# Patient Record
Sex: Female | Born: 1955 | Race: Black or African American | Hispanic: No | Smoking: Current every day smoker
Health system: Southern US, Community
[De-identification: ages and names within clinical notes are randomized; demographics above are authoritative.]

## PROBLEM LIST (undated history)

## (undated) DIAGNOSIS — F39 Unspecified mood [affective] disorder: Secondary | ICD-10-CM

## (undated) DIAGNOSIS — G2581 Restless legs syndrome: Secondary | ICD-10-CM

## (undated) DIAGNOSIS — E119 Type 2 diabetes mellitus without complications: Secondary | ICD-10-CM

## (undated) DIAGNOSIS — F32A Depression, unspecified: Secondary | ICD-10-CM

## (undated) DIAGNOSIS — I2510 Atherosclerotic heart disease of native coronary artery without angina pectoris: Secondary | ICD-10-CM

## (undated) DIAGNOSIS — J449 Chronic obstructive pulmonary disease, unspecified: Secondary | ICD-10-CM

## (undated) DIAGNOSIS — I10 Essential (primary) hypertension: Secondary | ICD-10-CM

## (undated) DIAGNOSIS — E079 Disorder of thyroid, unspecified: Secondary | ICD-10-CM

## (undated) DIAGNOSIS — F419 Anxiety disorder, unspecified: Secondary | ICD-10-CM

## (undated) DIAGNOSIS — F329 Major depressive disorder, single episode, unspecified: Secondary | ICD-10-CM

## (undated) DIAGNOSIS — G4730 Sleep apnea, unspecified: Secondary | ICD-10-CM

## (undated) DIAGNOSIS — E7800 Pure hypercholesterolemia, unspecified: Secondary | ICD-10-CM

## (undated) DIAGNOSIS — M1990 Unspecified osteoarthritis, unspecified site: Secondary | ICD-10-CM

## (undated) HISTORY — DX: Atherosclerotic heart disease of native coronary artery without angina pectoris: I25.10

## (undated) HISTORY — DX: Disorder of thyroid, unspecified: E07.9

## (undated) HISTORY — DX: Type 2 diabetes mellitus without complications: E11.9

## (undated) HISTORY — PX: TUBAL LIGATION: SHX77

## (undated) HISTORY — PX: CARPAL TUNNEL RELEASE: SHX101

## (undated) HISTORY — PX: CARDIAC CATHETERIZATION: SHX172

## (undated) HISTORY — PX: NECK SURGERY: SHX720

## (undated) HISTORY — DX: Major depressive disorder, single episode, unspecified: F32.9

## (undated) HISTORY — DX: Depression, unspecified: F32.A

## (undated) HISTORY — PX: BREAST SURGERY: SHX581

---

## 2014-08-14 ENCOUNTER — Telehealth: Payer: Self-pay | Admitting: Family Medicine

## 2014-08-14 NOTE — Telephone Encounter (Signed)
Patient aware that we will not be able to see her as a new patient at this time do to our providers not willing to write her pain medication or the xanax.

## 2014-08-15 ENCOUNTER — Telehealth: Payer: Self-pay | Admitting: Family Medicine

## 2014-08-15 NOTE — Telephone Encounter (Signed)
Pt Alexis Munoz she was told yesterday we couldn't take her on as a new pt. She called back today trying to schedule appt, advised pt we can't take her on as a new pt and that she could call another family practice. Pt voiced understanding.

## 2014-08-27 ENCOUNTER — Emergency Department (HOSPITAL_COMMUNITY)
Admission: EM | Admit: 2014-08-27 | Discharge: 2014-08-27 | Disposition: A | Discharge status: Home / Self Care | Attending: Emergency Medicine | Admitting: Emergency Medicine

## 2014-08-27 ENCOUNTER — Encounter (HOSPITAL_COMMUNITY): Payer: Self-pay | Admitting: Emergency Medicine

## 2014-08-27 DIAGNOSIS — J029 Acute pharyngitis, unspecified: Secondary | ICD-10-CM | POA: Insufficient documentation

## 2014-08-27 DIAGNOSIS — F419 Anxiety disorder, unspecified: Secondary | ICD-10-CM | POA: Diagnosis not present

## 2014-08-27 DIAGNOSIS — F319 Bipolar disorder, unspecified: Secondary | ICD-10-CM | POA: Insufficient documentation

## 2014-08-27 DIAGNOSIS — J449 Chronic obstructive pulmonary disease, unspecified: Secondary | ICD-10-CM | POA: Insufficient documentation

## 2014-08-27 DIAGNOSIS — Z880 Allergy status to penicillin: Secondary | ICD-10-CM | POA: Diagnosis not present

## 2014-08-27 DIAGNOSIS — Z79899 Other long term (current) drug therapy: Secondary | ICD-10-CM | POA: Diagnosis not present

## 2014-08-27 DIAGNOSIS — Z720 Tobacco use: Secondary | ICD-10-CM | POA: Diagnosis not present

## 2014-08-27 DIAGNOSIS — Z8669 Personal history of other diseases of the nervous system and sense organs: Secondary | ICD-10-CM | POA: Insufficient documentation

## 2014-08-27 DIAGNOSIS — Z760 Encounter for issue of repeat prescription: Secondary | ICD-10-CM | POA: Diagnosis not present

## 2014-08-27 DIAGNOSIS — M1990 Unspecified osteoarthritis, unspecified site: Secondary | ICD-10-CM | POA: Insufficient documentation

## 2014-08-27 DIAGNOSIS — E119 Type 2 diabetes mellitus without complications: Secondary | ICD-10-CM | POA: Insufficient documentation

## 2014-08-27 DIAGNOSIS — R21 Rash and other nonspecific skin eruption: Secondary | ICD-10-CM

## 2014-08-27 HISTORY — DX: Restless legs syndrome: G25.81

## 2014-08-27 HISTORY — DX: Anxiety disorder, unspecified: F41.9

## 2014-08-27 HISTORY — DX: Type 2 diabetes mellitus without complications: E11.9

## 2014-08-27 HISTORY — DX: Unspecified mood (affective) disorder: F39

## 2014-08-27 HISTORY — DX: Sleep apnea, unspecified: G47.30

## 2014-08-27 HISTORY — DX: Unspecified osteoarthritis, unspecified site: M19.90

## 2014-08-27 HISTORY — DX: Chronic obstructive pulmonary disease, unspecified: J44.9

## 2014-08-27 MED ORDER — ACYCLOVIR 800 MG PO TABS: 800.0000 mg | ORAL_TABLET | Freq: Every day | ORAL | Status: DC

## 2014-08-27 MED ORDER — BUDESONIDE-FORMOTEROL FUMARATE 160-4.5 MCG/ACT IN AERO: 2.0000 | INHALATION_SPRAY | Freq: Two times a day (BID) | RESPIRATORY_TRACT | Status: AC

## 2014-08-27 MED ORDER — HYDROCODONE-ACETAMINOPHEN 5-325 MG PO TABS
1.0000 | ORAL_TABLET | ORAL | Status: DC | PRN
Start: 2014-08-27 — End: 2015-05-22

## 2014-08-27 NOTE — ED Notes (Signed)
PA at bedside.

## 2014-08-27 NOTE — Discharge Instructions (Signed)
Pharyngitis Pharyngitis is a sore throat (pharynx). There is redness, pain, and swelling of your throat. HOME CARE   Drink enough fluids to keep your pee (urine) clear or pale yellow.  Only take medicine as told by your doctor.  You may get sick again if you do not take medicine as told. Finish your medicines, even if you start to feel better.  Do not take aspirin.  Rest.  Rinse your mouth (gargle) with salt water ( tsp of salt per 1 qt of water) every 1-2 hours. This will help the pain.  If you are not at risk for choking, you can suck on hard candy or sore throat lozenges. GET HELP IF:  You have large, tender lumps on your neck.  You have a rash.  You cough up green, yellow-brown, or bloody spit. GET HELP RIGHT AWAY IF:   You have a stiff neck.  You drool or cannot swallow liquids.  You throw up (vomit) or are not able to keep medicine or liquids down.  You have very bad pain that does not go away with medicine.  You have problems breathing (not from a stuffy nose). MAKE SURE YOU:   Understand these instructions.  Will watch your condition.  Will get help right away if you are not doing well or get worse. Document Released: 03/21/2008 Document Revised: 07/24/2013 Document Reviewed: 06/10/2013 [facility] Patient Information 2015 [place], [place]. This information is not intended to replace advice given to you by your health care provider. Make sure you discuss any questions you have with your health care provider.  Medication Refill, Emergency Department We have refilled your medication today as a courtesy to you. It is best for your medical care, however, to take care of getting refills done through your primary caregiver's office. They have your records and can do a better job of follow-up than we can in the emergency department. On maintenance medications, we often only prescribe enough medications to get you by until you are able to see your regular caregiver. This  is a more expensive way to refill medications. In the future, please plan for refills so that you will not have to use the emergency department for this. Thank you for your help. Your help allows us to better take care of the daily emergencies that enter our department. Document Released: 01/20/2004 Document Revised: 12/26/2011 Document Reviewed: 01/10/2014 [facility] Patient Information 2015 [place], [place]. This information is not intended to replace advice given to you by your health care provider. Make sure you discuss any questions you have with your health care provider.   You have been prescribed your Symbicort refill, use this daily as prescribed.  You may take the hydrocodone for throat pain relief and chronic pain relief, use caution as this medication will make you sleepy, do not drive within 4 hours of taking this medication.  As discussed, your rash is not clearly consistent with a shingles flare, however you have been prescribed acyclovir.  Hold this prescription as you may not need it.  If this rash continues to spread more painful, get this medicine filled and take as prescribed.  See the resource guide for assistance locating a local primary doctor.

## 2014-08-27 NOTE — ED Notes (Signed)
Pt c/o tonsilitis and area of rash to her R upper chest, onset yesterday. Pt states the rash itches but does not hurt.

## 2014-08-29 NOTE — ED Provider Notes (Signed)
CSN: [id number]     Arrival date & time 08/27/14  1028 History   First MD Initiated Contact with Patient 08/27/14 1131     Chief Complaint  Patient presents with  . Sore Throat  . Rash     (Consider location/radiation/quality/duration/timing/severity/associated sxs/prior Treatment) The history is provided by the patient.   Alexis Munoz is a 58 y.o. femalepresenting with a one-day history of sore throat and localized pruritic rash to her right upper chest.  She denies fevers or chills, headache, earache, but has had nasal congestion with clear rhinorrhea.  She has had no shortness of breath, cough or chest pain.  She has a localized area of itchy slightly raised red rash right upper chest wall without spreading.she describes a tingling sensation which reminds her of a mild form of shingles which she has had in the past.she is found no alleviators for her symptoms.  Additionally, has recently moved to [place] and has run out of several of her home medications including her Symbicort and her Xanax which she has not had in over one month.she is scheduled [facility] but is having difficulty locating a PCP accepting new patients.     Past Medical History  Diagnosis Date  . Arthritis   . Sleep apnea   . COPD (chronic obstructive pulmonary disease)   . Restless leg   . Diabetes mellitus without complication   . Mood disorder   . Bipolar 1 disorder   . Anxiety    Past Surgical History  Procedure Laterality Date  . Neck surgery    . Carpal tunnel release    . Tubal ligation     Family History  Problem Relation Age of Onset  . Cancer Mother   . Diabetes Father   . Cancer Father   . Diabetes Sister   . Heart failure Sister   . Schizophrenia Sister   . Cancer Brother    History  Substance Use Topics  . Smoking status: Current Every Day Smoker -- 0.50 packs/day    Types: Cigarettes  . Smokeless tobacco: Never Used  . Alcohol Use: No    OB History    Gravida Para Term Preterm AB TAB SAB Ectopic Multiple Living   2 2 2             Review of Systems  Constitutional: Negative.  Negative for fever.  HENT: Positive for congestion, rhinorrhea and sore throat.   Eyes: Negative.   Respiratory: Negative for cough, chest tightness, shortness of breath and wheezing.   Cardiovascular: Negative for chest pain.  Gastrointestinal: Negative for nausea and abdominal pain.  Genitourinary: Negative.   Musculoskeletal: Negative for joint swelling, arthralgias and neck pain.  Skin: Positive for rash. Negative for wound.  Neurological: Negative for dizziness, weakness, light-headedness, numbness and headaches.  Psychiatric/Behavioral: Negative for sleep disturbance, self-injury and agitation. The patient is nervous/anxious.       Allergies  Sulfa antibiotics and Amoxicillin  Home Medications   Prior to Admission medications   Medication Sig Start Date End Date Taking? Authorizing Provider  DULoxetine (CYMBALTA) 60 MG capsule Take 60 mg by mouth 2 (two) times daily.   Yes Historical Provider, MD  ipratropium-albuterol (DUONEB) 0.5-2.5 (3) MG/3ML SOLN Take 3 mLs by nebulization 4 (four) times daily.   Yes Historical Provider, MD  lamoTRIgine (LAMICTAL) 200 MG tablet Take 200 mg by mouth at bedtime.   Yes Historical Provider, MD  levothyroxine (SYNTHROID, LEVOTHROID) 50 MCG  tablet Take 50 mcg by mouth daily before breakfast.   Yes Historical Provider, MD  metoprolol succinate (TOPROL-XL) 25 MG 24 hr tablet Take 12.5 mg by mouth at bedtime.   Yes Historical Provider, MD  midodrine (PROAMATINE) 5 MG tablet Take 5 mg by mouth 2 (two) times daily with a meal.   Yes Historical Provider, MD  pravastatin (PRAVACHOL) 40 MG tablet Take 40 mg by mouth at bedtime.   Yes Historical Provider, MD  rOPINIRole (REQUIP) 1 MG tablet Take 1 mg by mouth at bedtime.   Yes Historical Provider, MD  vitamin B-12 (CYANOCOBALAMIN) 500 MCG tablet Take 500 mcg  by mouth daily.   Yes Historical Provider, MD  acyclovir (ZOVIRAX) 800 MG tablet Take 1 tablet (800 mg total) by mouth 5 (five) times daily. 08/27/14   Burgess AmorJulie Kennen Stammer, PA-C  ALPRAZolam Prudy Feeler(XANAX) 0.5 MG tablet Take 0.5 mg by mouth 3 (three) times daily.    Historical Provider, MD  budesonide-formoterol (SYMBICORT) 160-4.5 MCG/ACT inhaler Inhale 2 puffs into the lungs 2 (two) times daily. 08/27/14   Burgess AmorJulie Breslin Hemann, PA-C  HYDROcodone-acetaminophen (NORCO/VICODIN) 5-325 MG per tablet Take 1 tablet by mouth every 4 (four) hours as needed. 08/27/14   Burgess AmorJulie Tre Sanker, PA-C   BP 132/62 mmHg  Pulse 63  Temp(Src) 98.3 F (36.8 C) (Oral)  Resp 18  Ht 5\' 2"  (1.575 m)  Wt 150 lb (68.04 kg)  BMI 27.43 kg/m2  SpO2 99% Physical Exam  Constitutional: She is oriented to person, place, and time. She appears well-developed and well-nourished.  HENT:  Head: Normocephalic and atraumatic.  Right Ear: Tympanic membrane and ear canal normal.  Left Ear: Tympanic membrane and ear canal normal.  Nose: Mucosal edema and rhinorrhea present.  Mouth/Throat: Uvula is midline, oropharynx is clear and moist and mucous membranes are normal. No oropharyngeal exudate, posterior oropharyngeal edema, posterior oropharyngeal erythema or tonsillar abscesses.  Pharynx appears normal.  No head or neck lymphadenopathy.  Eyes: Conjunctivae are normal.  Cardiovascular: Normal rate, regular rhythm and normal heart sounds.   Pulmonary/Chest: Effort normal. No respiratory distress. She has no decreased breath sounds. She has no wheezes. She has no rhonchi. She has no rales.  Abdominal: Soft. There is no tenderness.  Musculoskeletal: Normal range of motion.  Neurological: She is alert and oriented to person, place, and time.  Skin: Skin is warm and dry. Rash noted. Rash is maculopapular.  Slightly raised maculopapular erythematous rash right upper anterior chest wall.  No drainage, no vesicles.  She has another similar lesion right back near her  spine which is nontender, also no drainage.  Psychiatric: She has a normal mood and affect.    ED Course  Procedures (including critical care time) Labs Review Labs Reviewed - No data to display  Imaging Review No results found.   EKG Interpretation None      MDM   Final diagnoses:  Sore throat  Rash  Medication refill    Patient with multiple complaints, suspect viral sore throat with no exam findings to suggest this being strep or other bacterial infection.her rash is fairly nonspecific and not tender as I would expect from shingles, but with her past history will prescribe acyclovir to catch this early in the event this actually is a shingles flare.  She was prescribed hydrocodone for pain including throat pain.  Refill given for her Symbicort.  She inquired about Xanax prescription which was deferred.  It has been over one month since she had this medication last so  benzo withdrawal is not a concern.  She has no symptoms today suggesting ongoing need for Xanax and she does have a pending follow-up with [facility] for this.  She was given referrals for establishing a PCP here locally.  When necessary follow-up anticipated.    Burgess AmorJulie Tanayia Wahlquist, PA-C 08/29/14 1747  Ward GivensIva L Knapp, MD 09/01/14 [phone]

## 2014-09-22 ENCOUNTER — Encounter (HOSPITAL_COMMUNITY): Payer: Self-pay | Admitting: *Deleted

## 2014-09-22 ENCOUNTER — Emergency Department (HOSPITAL_COMMUNITY)
Admission: EM | Admit: 2014-09-22 | Discharge: 2014-09-23 | Disposition: A | Discharge status: Home / Self Care | Attending: Emergency Medicine | Admitting: Emergency Medicine

## 2014-09-22 DIAGNOSIS — Z79899 Other long term (current) drug therapy: Secondary | ICD-10-CM | POA: Diagnosis not present

## 2014-09-22 DIAGNOSIS — M1990 Unspecified osteoarthritis, unspecified site: Secondary | ICD-10-CM | POA: Diagnosis not present

## 2014-09-22 DIAGNOSIS — Z7951 Long term (current) use of inhaled steroids: Secondary | ICD-10-CM | POA: Insufficient documentation

## 2014-09-22 DIAGNOSIS — F419 Anxiety disorder, unspecified: Secondary | ICD-10-CM | POA: Diagnosis not present

## 2014-09-22 DIAGNOSIS — J449 Chronic obstructive pulmonary disease, unspecified: Secondary | ICD-10-CM | POA: Diagnosis not present

## 2014-09-22 DIAGNOSIS — Z880 Allergy status to penicillin: Secondary | ICD-10-CM | POA: Diagnosis not present

## 2014-09-22 DIAGNOSIS — Z720 Tobacco use: Secondary | ICD-10-CM | POA: Insufficient documentation

## 2014-09-22 DIAGNOSIS — F10120 Alcohol abuse with intoxication, uncomplicated: Secondary | ICD-10-CM | POA: Diagnosis not present

## 2014-09-22 DIAGNOSIS — F319 Bipolar disorder, unspecified: Secondary | ICD-10-CM | POA: Insufficient documentation

## 2014-09-22 DIAGNOSIS — G2581 Restless legs syndrome: Secondary | ICD-10-CM | POA: Diagnosis not present

## 2014-09-22 DIAGNOSIS — F1010 Alcohol abuse, uncomplicated: Secondary | ICD-10-CM | POA: Diagnosis present

## 2014-09-22 DIAGNOSIS — F3131 Bipolar disorder, current episode depressed, mild: Secondary | ICD-10-CM

## 2014-09-22 DIAGNOSIS — F10920 Alcohol use, unspecified with intoxication, uncomplicated: Secondary | ICD-10-CM

## 2014-09-22 DIAGNOSIS — E119 Type 2 diabetes mellitus without complications: Secondary | ICD-10-CM | POA: Insufficient documentation

## 2014-09-22 NOTE — ED Provider Notes (Signed)
CSN: [id number]     Arrival date & time 09/22/14  2214 History  This chart was scribed for Ward Givens, MD by Annye Asa, ED Scribe. This patient was seen in room APA16A/APA16A and the patient's care was started at 12:17 AM.    Chief Complaint  Patient presents with  . Medical Clearance   The history is provided by the patient and a friend. No language interpreter was used.     HPI Comments: Alexis Munoz is a 58 y.o. female who presents to the Emergency Department for medical clearance and medication management. She reports that she has been out of her psych meds since September 2015 (she still has Cymbalta but ran out of Xanax and Lamictal in September); she was taking these medications for bipolar disorder. She is unable to explain why she has Cymbalta but does not have Xanax or Lamictal. She gets her medications by "[facility]." She reports no issue with her cholesterol or blood pressure medications; she has "a few" of these left at this time. She denies any prior admissions to a psychiatric hospital.   Patient reports that she has been drinking EtOH "because she doesn't have any meds." She reports regular drinking for approx. 12 years that had started escalating in the past year; she estimates that at most she drank 1 bottle of wine per day, every other day. Patient has had 3-4 Sparks today. She reports depression at present. She denies SI or HI at this time. She denies auditory hallucinations. She denies any prior admissions for detox or rehab.   Patient reports that she recently moved from [place] to [facility] and has not been able to establish care in the area; she does not have an appointment with [facility] at this time because "they are not accepting new patients." Patient states she has some type of VA insurance that the local doctors are not accepting.  PCP none  Past Medical History  Diagnosis Date  . Arthritis   . Sleep apnea   . COPD (chronic obstructive pulmonary disease)    . Restless leg   . Diabetes mellitus without complication   . Mood disorder   . Bipolar 1 disorder   . Anxiety    Past Surgical History  Procedure Laterality Date  . Neck surgery    . Carpal tunnel release    . Tubal ligation     Family History  Problem Relation Age of Onset  . Cancer Mother   . Diabetes Father   . Cancer Father   . Diabetes Sister   . Heart failure Sister   . Schizophrenia Sister   . Cancer Brother    History  Substance Use Topics  . Smoking status: Current Every Day Smoker -- 0.50 packs/day    Types: Cigarettes  . Smokeless tobacco: Never Used  . Alcohol Use: Yes     Comment: Drinks 3 days a week; 5-6 16oz Sparks     Patient does not work and is attempting to get disability. The last time she held a job was 2009; she worked in [employer]. Patient is a 1ppd smoker. She denies the use of illict drugs.   OB History    Gravida Para Term Preterm AB TAB SAB Ectopic Multiple Living   2 2 2             Review of Systems  All other systems reviewed and are negative.  Allergies  Sulfa antibiotics and Amoxicillin  Home Medications  Prior to Admission medications   Medication Sig Start Date End Date Taking? Authorizing Provider  acyclovir (ZOVIRAX) 800 MG tablet Take 1 tablet (800 mg total) by mouth 5 (five) times daily. 08/27/14   Burgess AmorJulie Idol, PA-C  ALPRAZolam Prudy Feeler(XANAX) 0.5 MG tablet Take 0.5 mg by mouth 3 (three) times daily.    Historical Provider, MD  budesonide-formoterol (SYMBICORT) 160-4.5 MCG/ACT inhaler Inhale 2 puffs into the lungs 2 (two) times daily. 08/27/14   Burgess AmorJulie Idol, PA-C  DULoxetine (CYMBALTA) 60 MG capsule Take 60 mg by mouth 2 (two) times daily.    Historical Provider, MD  HYDROcodone-acetaminophen (NORCO/VICODIN) 5-325 MG per tablet Take 1 tablet by mouth every 4 (four) hours as needed. 08/27/14   Burgess AmorJulie Idol, PA-C  ipratropium-albuterol (DUONEB) 0.5-2.5 (3) MG/3ML SOLN Take 3 mLs by nebulization 4 (four) times daily.    Historical  Provider, MD  lamoTRIgine (LAMICTAL) 200 MG tablet Take 200 mg by mouth at bedtime.    Historical Provider, MD  levothyroxine (SYNTHROID, LEVOTHROID) 50 MCG tablet Take 50 mcg by mouth daily before breakfast.    Historical Provider, MD  metoprolol succinate (TOPROL-XL) 25 MG 24 hr tablet Take 12.5 mg by mouth at bedtime.    Historical Provider, MD  midodrine (PROAMATINE) 5 MG tablet Take 5 mg by mouth 2 (two) times daily with a meal.    Historical Provider, MD  pravastatin (PRAVACHOL) 40 MG tablet Take 40 mg by mouth at bedtime.    Historical Provider, MD  rOPINIRole (REQUIP) 1 MG tablet Take 1 mg by mouth at bedtime.    Historical Provider, MD  vitamin B-12 (CYANOCOBALAMIN) 500 MCG tablet Take 500 mcg by mouth daily.    Historical Provider, MD   BP 119/54 mmHg  Pulse 85  Temp(Src) 98.6 F (37 C) (Oral)  Resp 20  Ht 5\' 2"  (1.575 m)  Wt 150 lb (68.04 kg)  BMI 27.43 kg/m2  SpO2 99%  Vital signs normal   Physical Exam  Constitutional: She is oriented to person, place, and time. She appears well-developed and well-nourished.  Non-toxic appearance. She does not appear ill. No distress.  Intoxicated with mild slurred speech  HENT:  Head: Normocephalic and atraumatic.  Right Ear: External ear normal.  Left Ear: External ear normal.  Nose: Nose normal. No mucosal edema or rhinorrhea.  Mouth/Throat: Oropharynx is clear and moist and mucous membranes are normal. No dental abscesses or uvula swelling.  Dry tongue; diffuse redness of upper and lower eyelids bilaterally  Eyes: Conjunctivae and EOM are normal. Pupils are equal, round, and reactive to light.  Neck: Normal range of motion and full passive range of motion without pain. Neck supple.  Cardiovascular: Normal rate, regular rhythm and normal heart sounds.  Exam reveals no gallop and no friction rub.   No murmur heard. Pulmonary/Chest: Effort normal and breath sounds normal. No respiratory distress. She has no wheezes. She has no  rhonchi. She has no rales. She exhibits no tenderness and no crepitus.  Abdominal: Soft. Normal appearance and bowel sounds are normal. She exhibits no distension. There is no tenderness. There is no rebound and no guarding.  Musculoskeletal: Normal range of motion. She exhibits no edema or tenderness.  Moves all extremities well.   Neurological: She is alert and oriented to person, place, and time. She has normal strength. No cranial nerve deficit.  Skin: Skin is warm, dry and intact. No rash noted. No erythema. No pallor.  Psychiatric: She has a normal mood and affect. Her speech  is normal and behavior is normal. Her mood appears not anxious.  Nursing note and vitals reviewed.   ED Course  Procedures   DIAGNOSTIC STUDIES: Oxygen Saturation is 99% on RA, normal by my interpretation.    COORDINATION OF CARE: 12:26 AM Discussed treatment plan with pt at bedside and pt agreed to plan.  02:28 Aurther Loft, TSS called to discuss reason for consult  03:09 Aurther Loft, TSS states pt discussed with her PA, feels pt just needs outpatient referrals, states she can be discharged at any time.   Labs Review Results for orders placed or performed during the hospital encounter of 09/22/14  Comprehensive metabolic panel  Result Value Ref Range   Sodium 141 137 - 147 mEq/L   Potassium 3.5 (L) 3.7 - 5.3 mEq/L   Chloride 102 96 - 112 mEq/L   CO2 25 19 - 32 mEq/L   Glucose, Bld 103 (H) 70 - 99 mg/dL   BUN 3 (L) 6 - 23 mg/dL   Creatinine, Ser 1.61 0.50 - 1.10 mg/dL   Calcium 8.5 8.4 - 09.6 mg/dL   Total Protein 6.4 6.0 - 8.3 g/dL   Albumin 3.6 3.5 - 5.2 g/dL   AST 16 0 - 37 U/L   ALT 14 0 - 35 U/L   Alkaline Phosphatase 102 39 - 117 U/L   Total Bilirubin 0.2 (L) 0.3 - 1.2 mg/dL   GFR calc non Af Amer >90 >90 mL/min   GFR calc Af Amer >90 >90 mL/min   Anion gap 14 5 - 15  Ethanol  Result Value Ref Range   Alcohol, Ethyl (B) 226 (H) 0 - 11 mg/dL  CBC with Differential  Result Value Ref Range   WBC 8.6  4.0 - 10.5 K/uL   RBC 4.64 3.87 - 5.11 MIL/uL   Hemoglobin 15.0 12.0 - 15.0 g/dL   HCT 04.5 40.9 - 81.1 %   MCV 95.3 78.0 - 100.0 fL   MCH 32.3 26.0 - 34.0 pg   MCHC 33.9 30.0 - 36.0 g/dL   RDW 91.4 78.2 - 95.6 %   Platelets 244 150 - 400 K/uL   Neutrophils Relative % 50 43 - 77 %   Neutro Abs 4.4 1.7 - 7.7 K/uL   Lymphocytes Relative 38 12 - 46 %   Lymphs Abs 3.3 0.7 - 4.0 K/uL   Monocytes Relative 7 3 - 12 %   Monocytes Absolute 0.6 0.1 - 1.0 K/uL   Eosinophils Relative 4 0 - 5 %   Eosinophils Absolute 0.3 0.0 - 0.7 K/uL   Basophils Relative 1 0 - 1 %   Basophils Absolute 0.0 0.0 - 0.1 K/uL  Urinalysis, Routine w reflex microscopic  Result Value Ref Range   Color, Urine STRAW (A) YELLOW   APPearance CLEAR CLEAR   Specific Gravity, Urine <1.005 (L) 1.005 - 1.030   pH 5.5 5.0 - 8.0   Glucose, UA NEGATIVE NEGATIVE mg/dL   Hgb urine dipstick NEGATIVE NEGATIVE   Bilirubin Urine NEGATIVE NEGATIVE   Ketones, ur NEGATIVE NEGATIVE mg/dL   Protein, ur NEGATIVE NEGATIVE mg/dL   Urobilinogen, UA 0.2 0.0 - 1.0 mg/dL   Nitrite NEGATIVE NEGATIVE   Leukocytes, UA NEGATIVE NEGATIVE  Urine rapid drug screen (hosp performed)  Result Value Ref Range   Opiates NONE DETECTED NONE DETECTED   Cocaine NONE DETECTED NONE DETECTED   Benzodiazepines NONE DETECTED NONE DETECTED   Amphetamines NONE DETECTED NONE DETECTED   Tetrahydrocannabinol NONE DETECTED NONE DETECTED   Barbiturates NONE  DETECTED NONE DETECTED   Laboratory interpretation all normal except except alcohol intoxication     Imaging Review No results found.   EKG Interpretation None      MDM   Final diagnoses:  Alcohol abuse  Alcohol intoxication, uncomplicated  Bipolar affective disorder, currently depressed, mild    Modified Medications   Modified Medication Previous Medication   DULOXETINE (CYMBALTA) 60 MG CAPSULE DULoxetine (CYMBALTA) 60 MG capsule      Take 1 capsule (60 mg total) by mouth 2 (two) times  daily.    Take 60 mg by mouth 2 (two) times daily.   LAMOTRIGINE (LAMICTAL) 200 MG TABLET lamoTRIgine (LAMICTAL) 200 MG tablet      Take 1 tablet (200 mg total) by mouth at bedtime.    Take 200 mg by mouth at bedtime.   Plan discharge  Devoria AlbeIva Jonathyn Carothers, MD, FACEP    I personally performed the services described in this documentation, which was scribed in my presence. The recorded information has been reviewed and considered.       Ward GivensIva L Reannon Candella, MD 09/23/14 [id number]

## 2014-09-22 NOTE — ED Notes (Signed)
Pt states she has been without her psych meds since September; pt states she has moved here and has not found a doctor yet; pt's friend states pt has turned to drinking alcohol because she is unable to get her meds.  Pt denies any SI/HI; pt states she has been drinking alcohol today

## 2014-09-23 ENCOUNTER — Encounter (HOSPITAL_COMMUNITY): Payer: Self-pay | Admitting: *Deleted

## 2014-09-23 LAB — CBC WITH DIFFERENTIAL/PLATELET
BASOS PCT: 1 % (ref 0–1)
Basophils Absolute: 0 10*3/uL (ref 0.0–0.1)
EOS ABS: 0.3 10*3/uL (ref 0.0–0.7)
Eosinophils Relative: 4 % (ref 0–5)
HCT: 44.2 % (ref 36.0–46.0)
Hemoglobin: 15 g/dL (ref 12.0–15.0)
Lymphocytes Relative: 38 % (ref 12–46)
Lymphs Abs: 3.3 10*3/uL (ref 0.7–4.0)
MCH: 32.3 pg (ref 26.0–34.0)
MCHC: 33.9 g/dL (ref 30.0–36.0)
MCV: 95.3 fL (ref 78.0–100.0)
MONOS PCT: 7 % (ref 3–12)
Monocytes Absolute: 0.6 10*3/uL (ref 0.1–1.0)
NEUTROS PCT: 50 % (ref 43–77)
Neutro Abs: 4.4 10*3/uL (ref 1.7–7.7)
Platelets: 244 10*3/uL (ref 150–400)
RBC: 4.64 MIL/uL (ref 3.87–5.11)
RDW: 14.3 % (ref 11.5–15.5)
WBC: 8.6 10*3/uL (ref 4.0–10.5)

## 2014-09-23 LAB — COMPREHENSIVE METABOLIC PANEL
ALT: 14 U/L (ref 0–35)
ANION GAP: 14 (ref 5–15)
AST: 16 U/L (ref 0–37)
Albumin: 3.6 g/dL (ref 3.5–5.2)
Alkaline Phosphatase: 102 U/L (ref 39–117)
BILIRUBIN TOTAL: 0.2 mg/dL — AB (ref 0.3–1.2)
BUN: 3 mg/dL — AB (ref 6–23)
CO2: 25 meq/L (ref 19–32)
Calcium: 8.5 mg/dL (ref 8.4–10.5)
Chloride: 102 mEq/L (ref 96–112)
Creatinine, Ser: 0.52 mg/dL (ref 0.50–1.10)
GFR calc Af Amer: >90 mL/min (ref >90)
GFR calc non Af Amer: >90 mL/min (ref >90)
Glucose, Bld: 103 mg/dL — ABNORMAL HIGH (ref 70–99)
POTASSIUM: 3.5 meq/L — AB (ref 3.7–5.3)
Sodium: 141 mEq/L (ref 137–147)
Total Protein: 6.4 g/dL (ref 6.0–8.3)

## 2014-09-23 LAB — RAPID URINE DRUG SCREEN, HOSP PERFORMED
Amphetamines: NOT DETECTED
Barbiturates: NOT DETECTED
Benzodiazepines: NOT DETECTED
Cocaine: NOT DETECTED
OPIATES: NOT DETECTED
TETRAHYDROCANNABINOL: NOT DETECTED

## 2014-09-23 LAB — URINALYSIS, ROUTINE W REFLEX MICROSCOPIC
Bilirubin Urine: NEGATIVE
Glucose, UA: NEGATIVE mg/dL
HGB URINE DIPSTICK: NEGATIVE
KETONES UR: NEGATIVE mg/dL
Leukocytes, UA: NEGATIVE
Nitrite: NEGATIVE
PROTEIN: NEGATIVE mg/dL
Specific Gravity, Urine: <1.005 — ABNORMAL LOW (ref 1.005–1.030)
Urobilinogen, UA: 0.2 mg/dL (ref 0.0–1.0)
pH: 5.5 (ref 5.0–8.0)

## 2014-09-23 LAB — ETHANOL: Alcohol, Ethyl (B): 226 mg/dL — ABNORMAL HIGH (ref 0–11)

## 2014-09-23 MED ORDER — ACETAMINOPHEN 325 MG PO TABS: 650.0000 mg | ORAL_TABLET | ORAL | Status: DC | PRN

## 2014-09-23 MED ORDER — PRAVASTATIN SODIUM 40 MG PO TABS
40.0000 mg | ORAL_TABLET | Freq: Every day | ORAL | Status: DC
  Filled 2014-09-23 (×2): qty 1

## 2014-09-23 MED ORDER — ONDANSETRON HCL 4 MG PO TABS: 4.0000 mg | ORAL_TABLET | Freq: Three times a day (TID) | ORAL | Status: DC | PRN

## 2014-09-23 MED ORDER — LORAZEPAM 1 MG PO TABS: 0.0000 mg | ORAL_TABLET | Freq: Four times a day (QID) | ORAL | Status: DC

## 2014-09-23 MED ORDER — CYANOCOBALAMIN 500 MCG PO TABS
500.0000 ug | ORAL_TABLET | Freq: Every day | ORAL | Status: DC
  Filled 2014-09-23 (×2): qty 1

## 2014-09-23 MED ORDER — MIDODRINE HCL 5 MG PO TABS
5.0000 mg | ORAL_TABLET | Freq: Two times a day (BID) | ORAL | Status: DC
  Filled 2014-09-23 (×3): qty 1

## 2014-09-23 MED ORDER — POTASSIUM CHLORIDE CRYS ER 20 MEQ PO TBCR: 40.0000 meq | EXTENDED_RELEASE_TABLET | Freq: Once | ORAL | Status: DC

## 2014-09-23 MED ORDER — LORAZEPAM 1 MG PO TABS: 0.0000 mg | ORAL_TABLET | Freq: Two times a day (BID) | ORAL | Status: DC

## 2014-09-23 MED ORDER — ALUM & MAG HYDROXIDE-SIMETH 200-200-20 MG/5ML PO SUSP: 30.0000 mL | ORAL | Status: DC | PRN

## 2014-09-23 MED ORDER — DULOXETINE HCL 20 MG PO CPEP: 60.0000 mg | ORAL_CAPSULE | Freq: Two times a day (BID) | ORAL | Status: DC

## 2014-09-23 MED ORDER — LAMOTRIGINE 200 MG PO TABS
200.0000 mg | ORAL_TABLET | Freq: Every day | ORAL | Status: DC
  Filled 2014-09-23 (×2): qty 1

## 2014-09-23 MED ORDER — ROPINIROLE HCL 1 MG PO TABS
1.0000 mg | ORAL_TABLET | Freq: Every day | ORAL | Status: DC
  Filled 2014-09-23 (×2): qty 1

## 2014-09-23 MED ORDER — IBUPROFEN 400 MG PO TABS: 600.0000 mg | ORAL_TABLET | Freq: Three times a day (TID) | ORAL | Status: DC | PRN

## 2014-09-23 MED ORDER — METOPROLOL SUCCINATE 12.5 MG HALF TABLET
12.5000 mg | ORAL_TABLET | Freq: Every day | ORAL | Status: DC
  Filled 2014-09-23 (×2): qty 1

## 2014-09-23 MED ORDER — LAMOTRIGINE 200 MG PO TABS: 200.0000 mg | ORAL_TABLET | Freq: Every day | ORAL | Status: DC

## 2014-09-23 MED ORDER — ZOLPIDEM TARTRATE 5 MG PO TABS: 10.0000 mg | ORAL_TABLET | Freq: Every evening | ORAL | Status: DC | PRN

## 2014-09-23 MED ORDER — DULOXETINE HCL 60 MG PO CPEP: 60.0000 mg | ORAL_CAPSULE | Freq: Two times a day (BID) | ORAL | Status: DC

## 2014-09-23 MED ORDER — NICOTINE 21 MG/24HR TD PT24: 21.0000 mg | MEDICATED_PATCH | Freq: Every day | TRANSDERMAL | Status: DC

## 2014-09-23 MED ORDER — LEVOTHYROXINE SODIUM 50 MCG PO TABS
50.0000 ug | ORAL_TABLET | Freq: Every day | ORAL | Status: DC
  Filled 2014-09-23 (×2): qty 1

## 2014-09-23 NOTE — Discharge Instructions (Signed)
Follow up as discussed with Alexis Munoz tonight, the mental health counselor.     Emergency Department Resource Guide 1) Find a Doctor and Pay Out of Pocket Although you won't have to find out who is covered by your insurance plan, it is a good idea to ask around and get recommendations. You will then need to call the office and see if the doctor you have chosen will accept you as a new patient and what types of options they offer for patients who are self-pay. Some doctors offer discounts or will set up payment plans for their patients who do not have insurance, but you will need to ask so you aren't surprised when you get to your appointment.  2) Contact Your Local Health Department Not all health departments have doctors that can see patients for sick visits, but many do, so it is worth a call to see if yours does. If you don't know where your local health department is, you can check in your phone book. The CDC also has a tool to help you locate your state's health department, and many state websites also have listings of all of their local health departments.  3) Find a Walk-in Clinic If your illness is not likely to be very severe or complicated, you may want to try a walk in clinic. These are popping up all over the country in pharmacies, drugstores, and shopping centers. They're usually staffed by nurse practitioners or physician assistants that have been trained to treat common illnesses and complaints. They're usually fairly quick and inexpensive. However, if you have serious medical issues or chronic medical problems, these are probably not your best option.  No Primary Care Doctor: - Call Health Connect at  [phone] - they can help you locate a primary care doctor that  accepts your insurance, provides certain services, etc. - Physician Referral Service- [phone]  Chronic Pain Problems: Organization         Address  Phone   Notes  [facility]  [phone]  Patients need to be referred by their primary care doctor.   Medication Assistance: Organization         Address  Phone   Notes  [facility] [address] [place], [place] [address] [phone] --Must be a resident of [facility] [place] -- Must have NO insurance coverage whatsoever (no Medicaid/ Medicare, etc.) -- The pt. MUST have a primary care doctor that directs their care regularly and follows them in the community   MedAssist  [phone]   [employer]  [phone]) [phone]    Agencies that provide inexpensive medical care: Organization         Address  Phone   Notes  [facility]  [phone]   [facility]    [phone]   [facility] [address] [place], [place] [address] [phone]   [facility] [address], [place] [phone]) [phone]   [facility]    [phone]   [facility]    [phone]   [facility]  [address], [place] Phone:  [phone], Fax:  [phone]) [phone] Hours of Operation:  9 am - 6 pm, M-F.  Also accepts Medicaid/Medicare and self-pay.  [facility]  [address], [place] Phone: [phone]) [phone], Fax: [phone]. Hours of Operation:  8:30 am -  5:30 pm, M-F.  Also accepts Medicaid and self-pay.  [facility] [place] [address], [place] Phone: [phone]   [place], [place], [place] [phone], Ext. [phone] Mondays & Thursdays: 7-9 AM.  First 15 patients are seen on a first come, first serve basis.    [place] Providers:  Organization         Address  Phone   Notes  [facility] [address], [place] [phone] Also accepts self-pay patients.  [facility] [id number] [place], [place]  [phone]   [place], [address], [place] [phone]   [facility] [address] [place] [phone]   Lucianne Lei [address], [place]   [phone] Only accepts [place] Access [place] patients after they have their name applied to their card.   Self-Pay (no insurance) in [facility]:  Organization         Address  Phone   Notes  Sickle Cell Patients, [facility] [place] [phone]   [facility] [place] [phone]   [facility] [place]  [place], [place], [place] [phone]   [facility]/Dr. Osei-Bonsu  [address], [place] or [address], [place] [phone] Phone number for both [place] and [place] locations is the same.  [facility] [address] [place] [phone]   [facility] [address] [place] or [address] [phone]   [facility] [address], [place] [phone], phone; [phone], fax Sees patients 1st and 3rd Saturday of every month.  Must not qualify for public or private insurance (i.e. Medicaid, Medicare, [place] Health Choice, Veterans' Benefits)  Household income should be no more than 200% of the poverty level The clinic cannot treat you if you are pregnant or think you are pregnant  Sexually transmitted diseases are not treated at the clinic.    Dental Care: Organization         Address  Phone  Notes  [facility] [place] [phone] Accepts children up to age 47 who are enrolled in [place] or [place]; pregnant women with a Medicaid card; and children who have applied for Medicaid or [place] Health Choice, but were  declined, whose parents can pay a reduced fee at time of service.  [facility]  [address], [place] [phone] Accepts children up to age 4 who are enrolled in [place] or [place]; pregnant women with a Medicaid card; and children who have applied for Medicaid or [place] Health Choice, but were declined, whose parents can pay a reduced fee at time of service.  [facility]  [place] [phone] Patients are seen by appointment only. Walk-ins are not accepted. [place] will see patients 45 years of age and older. Monday - Tuesday (8am-5pm) Most Wednesdays (8:30-5pm) $30 per visit, cash only  [facility]  [address], [place] [phone] Patients are seen by appointment only. Walk-ins are  not accepted. [place] will see patients 44 years of age and older. One Wednesday Evening (Monthly: Volunteer Based).  $30 per visit, cash only  [place]  [phone] for adults; Children under age 9, call Graduate Pediatric Dentistry at [phone]. Children aged 15-14, please call [phone] to request a pediatric application.  Dental services are provided in all areas of dental care including fillings, crowns and bridges, complete and partial dentures, implants, gum treatment, root canals, and extractions. Preventive care is also provided. Treatment is provided to both adults and children. Patients are selected via a lottery and there is often a waiting list.   [facility] [address], [place]  [phone] [email or web address]   [facility] [address] [place], [place] [phone], Ext. [phone] Second and Fourth Thursday of each month, opens at 6:30 AM; Clinic ends at 9 AM.  Patients are seen on a first-come first-served basis, and a limited number are seen during each clinic.    [facility]  [address] Hillard Danker [place], [place] [phone]   Eligibility Requirements You must have lived in [place], [place], or [place] counties for at least the last three months.   You cannot be eligible for state or federal sponsored [employer], including [employer], [place], or [employer].   You generally cannot be eligible for healthcare insurance through your employer.    How to apply: Eligibility screenings are held every Tuesday and Wednesday afternoon from 1:00 pm until 4:00 pm. You do not need an appointment for the interview!  [facility] [address] [place], [place]   [place]  [place] Department  [place]  [phone]    Behavioral Health Resources in the Community: Intensive Outpatient Programs Organization         Address  Phone  Notes  [facility] [place] [facility] [place]. [address], [place], [place] [phone]   [facility] [address] [place], [place]   ADS: [facility] [address] [place], [place]   [place] [address],  [place], [place] or [phone]   Substance Abuse Resources Organization         Address  Phone  Notes  [facility] Drug Services  [phone]   [place]  [phone]   [facility] [place]   Chinita Pester  [phone]   Residential & Outpatient Substance Abuse Program  [phone]   Psychological Services Organization         Address  Phone  Notes  [facility] [place]  [place]  [phone]   [place] [address], [place] or [phone]    Mobile Crisis Teams Organization         Address  Phone  Notes  [facility], Mobile Crisis Care  Unit  [phone]   [facility]  [address] [place], [place]   [place] Levels [address], [place] [phone]    Self-Help/Support Groups Organization         Address  Phone             Notes  [place]. of [place] - variety of support groups  [place] Call for more information  Narcotics Anonymous (NA),  [facility] [address], °[place]  2 meetings at this location  ° °Residential Treatment Programs °Organization         Address  Phone  Notes  °[facility] [address],    °[place]  [phone]   °[facility] ° [address], [place], [place] [phone]   °[facility] [address], [place] [phone] Admissions: 8am-3pm M-F  °[facility] [address]    °[place], [place] [phone]   °[facility] [address], [place], [place] [phone]   °[facility] [address]  °[place], [place] [phone]   °[facility] - Intensive Outpatient [address], [place], [place] [phone]   °[facility] [address]  °[place], [place] 1-877-615-2722 or [phone]   °[facility] ([facility]) [address] [place], [place] [phone] Accepts Medicaid  °[facility] [address]  ° [place] 1-800-659-3381 Substance Abuse/Addiction Treatment  ° °[place] Behavioral Health Resources °Organization         Address  Phone  Notes  °[facility]  (888) [phone]   °Julie Brannon, PhD [address] [place], [place]   ([phone]) [phone] or (336) 951-0000   °[facility]   [address] °[place], [place] ([phone]) [phone]   °[facility] [address], [place], [place] ([phone]) [phone] Insurance/Medicaid/sponsorship through [facility]  °[facility] [address]                                     [place], [place] [phone] Therapy/tele-psych/case  °[facility] [address]  ° [place],  ([phone]) [phone]    °Dr. Arfeen  ([phone]) [phone]   °[facility] [place]  [employer] [place] [facility] 1) [address], [place] °2) [address], [place] °3)  [address], [place] (336) [phone] °(336) [phone] ° °(336) [phone]   °[place] Child Abuse Hotline (336) 342-1394 or (336) [phone] (After Hours)    ° ° ° °

## 2014-09-23 NOTE — ED Notes (Signed)
Pt receiving telepsych at this time 

## 2014-09-23 NOTE — BH Assessment (Signed)
Tele Assessment Note   Alexis PeppersShelby J Munoz is a 58 y.o. female who [facility].  Pt reports that she been off her medications since September 2015, [place] and has not been able to locate a psychiatrist.  Pt states that she began to self medicate with alcohol, consuming 5-6 16 oz Sparks, 3 days week. Pt.'s last intake was 09/22/14, she drank 3-16oz Sparks.  Pt denies SI/HI/AVH.  Pt told this Clinical research associatewriter that she was assistance with getting her medications and also her excessive drinking.  Pt denies current w/d sxs, no legal issues, no blackouts/seizures from drinking.  Pt lives with her sister and feels safe returning home. This Clinical research associatewriter discussed interview with Donell SievertSpencer Simon, PA , who recommends outpatient referrals, Dr. Lynelle DoctorKnapp agreed with disposition and pt will be d/c'd home with [facility].   Axis I: Alcohol Abuse and Bipolar, Depressed Axis II: Deferred Axis III:  Past Medical History  Diagnosis Date  . Arthritis   . Sleep apnea   . COPD (chronic obstructive pulmonary disease)   . Restless leg   . Diabetes mellitus without complication   . Mood disorder   . Bipolar 1 disorder   . Anxiety    Axis IV: other psychosocial or environmental problems, problems related to social environment, problems with access to health care services and problems with primary support group Axis V: 41-50 serious symptoms  Past Medical History:  Past Medical History  Diagnosis Date  . Arthritis   . Sleep apnea   . COPD (chronic obstructive pulmonary disease)   . Restless leg   . Diabetes mellitus without complication   . Mood disorder   . Bipolar 1 disorder   . Anxiety     Past Surgical History  Procedure Laterality Date  . Neck surgery    . Carpal tunnel release    . Tubal ligation      Family History:  Family History  Problem Relation Age of Onset  . Cancer Mother   . Diabetes Father   . Cancer Father   . Diabetes Sister   .  Heart failure Sister   . Schizophrenia Sister   . Cancer Brother     Social History:  reports that she has been smoking Cigarettes.  She has been smoking about 0.50 packs per day. She has never used smokeless tobacco. She reports that she drinks alcohol. She reports that she does not use illicit drugs.  Additional Social History:  Alcohol / Drug Use Pain Medications: See MAR  Prescriptions: See MAR  Over the Counter: See MAR  History of alcohol / drug use?: Yes Longest period of sobriety (when/how long): No previous detox tx  Negative Consequences of Use: Work / Programmer, multimediachool, Personal relationships Withdrawal Symptoms: Other (Comment) (No current w/d sxs ) Substance #1 Name of Substance 1: Alcohol  1 - Age of First Use: 35 YOF  1 - Amount (size/oz): 5-6 16oz Sparks 1 - Frequency: 3x's [place]  1 - Duration: On-going  1 - Last Use / Amount: 09/22/14  CIWA: CIWA-Ar BP: (!) 119/54 mmHg Pulse Rate: 85 COWS:    PATIENT STRENGTHS: (choose at least two) General fund of knowledge Motivation for treatment/growth Supportive family/friends  Allergies:  Allergies  Allergen Reactions  . Sulfa Antibiotics Nausea Only  . Amoxicillin Rash    Home Medications:  (Not in a hospital admission)  OB/GYN Status:  No LMP recorded. Patient is postmenopausal.  General Assessment Data  [facility] of Assessment: [facility] Is this a Tele or Face-to-Face Assessment?: Tele Assessment Is this an Initial Assessment or a Re-assessment for this encounter?: Initial Assessment Living Arrangements: Other relatives (Lives with sister ) Can pt return to current living arrangement?: Yes Admission Status: Voluntary Is patient capable of signing voluntary admission?: Yes Transfer from: Home Referral Source: Self/Family/Friend  Medical Screening Exam [facility] Walk-in ONLY) Medical Exam completed: No Reason for MSE not completed: Other: (None )  [facility] Crisis Care Plan Living Arrangements: Other relatives (Lives with  sister ) Name of Psychiatrist: None  Name of Therapist: None   Education Status Is patient currently in school?: No Current Grade: None Highest grade of school patient has completed: None  Name of school: None  Contact person: None   Risk to self with the past 6 months Suicidal Ideation: No Suicidal Intent: No Is patient at risk for suicide?: No Suicidal Plan?: No Access to Means: No What has been your use of drugs/alcohol within the last 12 months?: Abusing: alcohol ` Previous Attempts/Gestures: No How many times?: 0 Other Self Harm Risks: None  Triggers for Past Attempts: None known Intentional Self Injurious Behavior: None Family Suicide History: No Recent stressful life event(s): Other (Comment) (Off meds since Sept 2015) Persecutory voices/beliefs?: No Depression: Yes Depression Symptoms: Loss of interest in usual pleasures Substance abuse history and/or treatment for substance abuse?: Yes Suicide prevention information given to non-admitted patients: Not applicable  Risk to Others within the past 6 months Homicidal Ideation: No Thoughts of Harm to Others: No Current Homicidal Intent: No Current Homicidal Plan: No Access to Homicidal Means: No Identified Victim: None  History of harm to others?: No Assessment of Violence: None Noted Violent Behavior Description: None  Does patient have access to weapons?: No Criminal Charges Pending?: No Does patient have a court date: No  Psychosis Hallucinations: None noted Delusions: None noted  Mental Status Report Appear/Hygiene: In scrubs Eye Contact: Good Motor Activity: Unsteady Speech: Logical/coherent, Soft Level of Consciousness: Alert, Quiet/awake Mood: Depressed Affect: Depressed Anxiety Level: None Thought Processes: Coherent, Relevant Judgement: Unimpaired Orientation: Person, Place, Time, Situation Obsessive Compulsive Thoughts/Behaviors: None  Cognitive Functioning Concentration: Normal Memory:  Recent Intact, Remote Intact IQ: Average Insight: Good Impulse Control: Good Appetite: Fair Weight Loss: 0 Weight Gain: 0 Sleep: No Change Total Hours of Sleep: 5 Vegetative Symptoms: None  ADLScreening [facility] Assessment Services) Patient's cognitive ability adequate to safely complete daily activities?: Yes Patient able to express need for assistance with ADLs?: Yes Independently performs ADLs?: Yes (appropriate for developmental age)  Prior Inpatient Therapy Prior Inpatient Therapy: No Prior Therapy Dates: None  Prior Therapy Facilty/Provider(s): None  Reason for Treatment: None   Prior Outpatient Therapy Prior Outpatient Therapy: No Prior Therapy Dates: None  Prior Therapy Facilty/Provider(s): None  Reason for Treatment: None   ADL Screening (condition at time of admission) Patient's cognitive ability adequate to safely complete daily activities?: Yes Is the patient deaf or have difficulty hearing?: No Does the patient have difficulty seeing, even when wearing glasses/contacts?: No Does the patient have difficulty concentrating, remembering, or making decisions?: No Patient able to express need for assistance with ADLs?: Yes Does the patient have difficulty dressing or bathing?: No Independently performs ADLs?: Yes (appropriate for developmental age) Does the patient have difficulty walking or climbing stairs?: No Weakness of Legs: None Weakness of Arms/Hands: None  Home Assistive Devices/Equipment Home Assistive Devices/Equipment: None  Therapy Consults (therapy consults require a physician order) PT Evaluation Needed: No OT Evalulation  Needed: No SLP Evaluation Needed: No Abuse/Neglect Assessment (Assessment to be complete while patient is alone) Physical Abuse: Denies Verbal Abuse: Denies Sexual Abuse: Denies Exploitation of patient/patient's resources: Denies Self-Neglect: Denies Values / Beliefs Cultural Requests During Hospitalization: None Spiritual  Requests During Hospitalization: None Consults Spiritual Care Consult Needed: No Social Work Consult Needed: No Merchant navy officer (For Healthcare) Does patient have an advance directive?: No Would patient like information on creating an advanced directive?: No - patient declined information Nutrition Screen- [facility] Adult/[facility] Patient's home diet: Regular  Additional Information 1:1 In Past 12 Months?: No CIRT Risk: No Elopement Risk: No Does patient have medical clearance?: Yes     Disposition:  Disposition Initial Assessment Completed for this Encounter: Yes Disposition of Patient: Outpatient treatment (Per Donell Sievert, PA outpt w/referrals ) Type of outpatient treatment: Adult (Per Donell Sievert, PA outpt w/referrals )  Beatrix Shipper C 09/23/2014 3:09 AM

## 2015-01-05 ENCOUNTER — Other Ambulatory Visit (HOSPITAL_COMMUNITY): Payer: Self-pay | Admitting: Nurse Practitioner

## 2015-01-05 DIAGNOSIS — Z1231 Encounter for screening mammogram for malignant neoplasm of breast: Secondary | ICD-10-CM

## 2015-02-23 ENCOUNTER — Ambulatory Visit (HOSPITAL_COMMUNITY): Payer: Non-veteran care

## 2015-02-26 ENCOUNTER — Ambulatory Visit (HOSPITAL_COMMUNITY): Payer: Non-veteran care

## 2015-03-09 ENCOUNTER — Ambulatory Visit (HOSPITAL_COMMUNITY): Payer: Non-veteran care

## 2015-03-23 ENCOUNTER — Emergency Department (HOSPITAL_COMMUNITY)

## 2015-03-23 ENCOUNTER — Ambulatory Visit (HOSPITAL_COMMUNITY): Payer: Non-veteran care

## 2015-03-23 ENCOUNTER — Emergency Department (HOSPITAL_COMMUNITY)
Admission: EM | Admit: 2015-03-23 | Discharge: 2015-03-23 | Disposition: A | Discharge status: Home / Self Care | Attending: Emergency Medicine | Admitting: Emergency Medicine

## 2015-03-23 ENCOUNTER — Encounter (HOSPITAL_COMMUNITY): Payer: Self-pay | Admitting: *Deleted

## 2015-03-23 DIAGNOSIS — F319 Bipolar disorder, unspecified: Secondary | ICD-10-CM | POA: Diagnosis not present

## 2015-03-23 DIAGNOSIS — R079 Chest pain, unspecified: Secondary | ICD-10-CM | POA: Diagnosis not present

## 2015-03-23 DIAGNOSIS — J440 Chronic obstructive pulmonary disease with acute lower respiratory infection: Secondary | ICD-10-CM | POA: Diagnosis not present

## 2015-03-23 DIAGNOSIS — M1990 Unspecified osteoarthritis, unspecified site: Secondary | ICD-10-CM | POA: Insufficient documentation

## 2015-03-23 DIAGNOSIS — Z720 Tobacco use: Secondary | ICD-10-CM | POA: Diagnosis not present

## 2015-03-23 DIAGNOSIS — Z880 Allergy status to penicillin: Secondary | ICD-10-CM | POA: Diagnosis not present

## 2015-03-23 DIAGNOSIS — Z7951 Long term (current) use of inhaled steroids: Secondary | ICD-10-CM | POA: Diagnosis not present

## 2015-03-23 DIAGNOSIS — Z79899 Other long term (current) drug therapy: Secondary | ICD-10-CM | POA: Diagnosis not present

## 2015-03-23 DIAGNOSIS — F419 Anxiety disorder, unspecified: Secondary | ICD-10-CM | POA: Insufficient documentation

## 2015-03-23 DIAGNOSIS — G2581 Restless legs syndrome: Secondary | ICD-10-CM | POA: Diagnosis not present

## 2015-03-23 DIAGNOSIS — Z7952 Long term (current) use of systemic steroids: Secondary | ICD-10-CM | POA: Diagnosis not present

## 2015-03-23 DIAGNOSIS — J40 Bronchitis, not specified as acute or chronic: Secondary | ICD-10-CM

## 2015-03-23 DIAGNOSIS — E119 Type 2 diabetes mellitus without complications: Secondary | ICD-10-CM | POA: Insufficient documentation

## 2015-03-23 DIAGNOSIS — R05 Cough: Secondary | ICD-10-CM | POA: Diagnosis present

## 2015-03-23 MED ORDER — BENZONATATE 100 MG PO CAPS: 100.0000 mg | ORAL_CAPSULE | Freq: Three times a day (TID) | ORAL | Status: DC

## 2015-03-23 MED ORDER — AZITHROMYCIN 250 MG PO TABS: 250.0000 mg | ORAL_TABLET | Freq: Every day | ORAL | Status: DC

## 2015-03-23 MED ORDER — KETOROLAC TROMETHAMINE 60 MG/2ML IM SOLN
60.0000 mg | Freq: Once | INTRAMUSCULAR | Status: AC
  Administered 2015-03-23: 60 mg via INTRAMUSCULAR
  Filled 2015-03-23: qty 2

## 2015-03-23 MED ORDER — PREDNISONE 20 MG PO TABS: 40.0000 mg | ORAL_TABLET | Freq: Every day | ORAL | Status: DC

## 2015-03-23 MED ORDER — TRAMADOL HCL 50 MG PO TABS: 50.0000 mg | ORAL_TABLET | Freq: Four times a day (QID) | ORAL | Status: DC | PRN

## 2015-03-23 NOTE — Discharge Instructions (Signed)
Please call your doctor for a followup appointment within 24-48 hours. When you talk to your doctor please let them know that you were seen in the emergency department and have them acquire all of your records so that they can discuss the findings with you and formulate a treatment plan to fully care for your new and ongoing problems. ° °

## 2015-03-23 NOTE — ED Notes (Signed)
Cough for 1 week, pain under lt ear tender to touch.  No fever, "sweats a lot".  Cough is unproductive.    Pain lt chest for 2-3 days  Increased pain with deep breath and cough

## 2015-03-23 NOTE — ED Provider Notes (Signed)
CSN: [id number]     Arrival date & time 03/23/15  1711 History   First MD Initiated Contact with Patient 03/23/15 1802     Chief Complaint  Patient presents with  . Cough     (Consider location/radiation/quality/duration/timing/severity/associated sxs/prior Treatment) HPI Comments: 59 year old female, history of COPD, diabetes, bipolar disorder who presents with approximately 4 days of cough which is nonproductive, persistent throughout the day in the evening, associated with sweats and pain when she coughs. The pain is on the left side of her chest, it is not present with exertion or position, there is no associated leg swelling, recent travel, Astra June use, history of cancer, injuries or prior venous thromboembolism. She has had no medications prior to arrival though she does take albuterol nebulizers and Symbicort medications at home.  She also reports she was supposed to have a screening mammogram today, she did not show because of having the cough. She is having some left-sided breast pain to palpation.  Patient is a 59 y.o. female presenting with cough. The history is provided by the patient.  Cough   Past Medical History  Diagnosis Date  . Arthritis   . Sleep apnea   . COPD (chronic obstructive pulmonary disease)   . Restless leg   . Diabetes mellitus without complication   . Mood disorder   . Bipolar 1 disorder   . Anxiety    Past Surgical History  Procedure Laterality Date  . Neck surgery    . Carpal tunnel release    . Tubal ligation    . Breast surgery     Family History  Problem Relation Age of Onset  . Cancer Mother   . Diabetes Father   . Cancer Father   . Diabetes Sister   . Heart failure Sister   . Schizophrenia Sister   . Cancer Brother    History  Substance Use Topics  . Smoking status: Current Every Day Smoker -- 0.50 packs/day    Types: Cigarettes  . Smokeless tobacco: Never Used  . Alcohol Use: Yes     Comment: Drinks 3 days a week; 5-6 16oz  [place]    OB History    Gravida Para Term Preterm AB TAB SAB Ectopic Multiple Living   Review of Systems  Respiratory: Positive for cough.   All other systems reviewed and are negative.     Allergies  Sulfa antibiotics and Amoxicillin  Home Medications   Prior to Admission medications   Medication Sig Start Date End Date Taking? Authorizing Provider  acyclovir (ZOVIRAX) 800 MG tablet Take 1 tablet (800 mg total) by mouth 5 (five) times daily. 08/27/14   Burgess Amor, PA-C  ALPRAZolam Prudy Feeler) 0.5 MG tablet Take 0.5 mg by mouth 3 (three) times daily.    Historical Provider, MD  azithromycin (ZITHROMAX Z-PAK) 250 MG tablet Take 1 tablet (250 mg total) by mouth daily.  PO day 1, then  PO days 205 03/23/15   Eber Hong, MD  benzonatate (TESSALON) 100 MG capsule Take 1 capsule (100 mg total) by mouth every 8 (eight) hours. 03/23/15   Eber Hong, MD  budesonide-formoterol [facility]) 160-4.5 MCG/ACT inhaler Inhale 2 puffs into the lungs 2 (two) times daily. 08/27/14   Burgess Amor, PA-C  DULoxetine (CYMBALTA) 60 MG capsule Take 1 capsule (60 mg total) by mouth 2 (two) times daily. 09/23/14   Devoria Albe, MD  HYDROcodone-acetaminophen (NORCO/VICODIN) 5-325 MG per  tablet Take 1 tablet by mouth every 4 (four) hours as needed. 08/27/14   Burgess Amor, PA-C  ipratropium-albuterol (DUONEB) 0.5-2.5 (3) MG/3ML SOLN Take 3 mLs by nebulization 4 (four) times daily.    Historical Provider, MD  lamoTRIgine (LAMICTAL) 200 MG tablet Take 1 tablet (200 mg total) by mouth at bedtime. 09/23/14   Devoria Albe, MD  levothyroxine (SYNTHROID, LEVOTHROID) 50 MCG tablet Take 50 mcg by mouth daily before breakfast.    Historical Provider, MD  metoprolol succinate (TOPROL-XL) 25 MG 24 hr tablet Take 12.5 mg by mouth at bedtime.    Historical Provider, MD  midodrine (PROAMATINE) 5 MG tablet Take 5 mg by mouth 2 (two) times daily with a meal.    Historical Provider, MD  pravastatin (PRAVACHOL) 40 MG  tablet Take 40 mg by mouth at bedtime.    Historical Provider, MD  predniSONE (DELTASONE) 20 MG tablet Take 2 tablets (40 mg total) by mouth daily. 03/23/15   Eber Hong, MD  rOPINIRole (REQUIP) 1 MG tablet Take 1 mg by mouth at bedtime.    Historical Provider, MD  traMADol (ULTRAM) 50 MG tablet Take 1 tablet (50 mg total) by mouth every 6 (six) hours as needed. 03/23/15   Eber Hong, MD  vitamin B-12 (CYANOCOBALAMIN) 500 MCG tablet Take 500 mcg by mouth daily.    Historical Provider, MD   BP 113/88 mmHg  Pulse 80  Temp(Src) 98.4 F (36.9 C) (Oral)  Resp 16  Ht  (1.6 m)  Wt 165 lb (74.844 kg)  BMI 29.24 kg/m2  SpO2 100% Physical Exam  Constitutional: She appears well-developed and well-nourished. No distress.  HENT:  Head: Normocephalic and atraumatic.  Mouth/Throat: Oropharynx is clear and moist. No oropharyngeal exudate.  Eyes: Conjunctivae and EOM are normal. Pupils are equal, round, and reactive to light. Right eye exhibits no discharge. Left eye exhibits no discharge. No scleral icterus.  Neck: Normal range of motion. Neck supple. No JVD present. No thyromegaly present.  Cardiovascular: Normal rate, regular rhythm, normal heart sounds and intact distal pulses.  Exam reveals no gallop and no friction rub.   No murmur heard. Pulmonary/Chest: Effort normal and breath sounds normal. No respiratory distress. She has no wheezes. She has no rales. She exhibits tenderness ( Tender to palpation over the left side of the chest, no masses redness abscesses or otherwise.).  Abdominal: Soft. Bowel sounds are normal. She exhibits no distension and no mass. There is no tenderness.  Musculoskeletal: Normal range of motion. She exhibits no edema or tenderness.  Lymphadenopathy:    She has no cervical adenopathy.  Neurological: She is alert. Coordination normal.  Skin: Skin is warm and dry. No rash noted. No erythema.  Psychiatric: She has a normal mood and affect. Her behavior is normal.   Nursing note and vitals reviewed.   ED Course  Procedures (including critical care time) Labs Review Labs Reviewed  CBC WITH DIFFERENTIAL/PLATELET  TROPONIN I  COMPREHENSIVE METABOLIC PANEL    Imaging Review Dg Chest 2 View  03/23/2015   CLINICAL DATA:  Nonproductive cough for 1 week. Left-sided chest pain.  EXAM: CHEST  2 VIEW  COMPARISON:  None.  FINDINGS: Mild interstitial accentuation, as can commonly be encountered in smokers. Cardiac and mediastinal margins appear normal. There is mild subsegmental atelectasis in the posterior basal segment left lower lobe. Borderline airway thickening.  IMPRESSION: 1. Borderline airway thickening which may reflect bronchitis reactive airways disease. 2. Subsegmental atelectasis in the left lower lobe. 3.  Mild interstitial accentuation, as can commonly be encountered in smokers.   Electronically Signed   By: Gaylyn RongWalter  Liebkemann M.D.   On: 03/23/2015 17:46     EKG Interpretation   Date/Time:  Monday March 23 2015 17:30:50 EDT Ventricular Rate:  83 PR Interval:  124 QRS Duration: 96 QT Interval:  382 QTC Calculation: 448 R Axis:   -61 Text Interpretation:  Normal sinus rhythm Left anterior fascicular block  Abnormal ECG No old tracing to compare Confirmed by Lacrisha Bielicki  MD, Tavis Kring  [phone]) on 03/23/2015 6:02:39 PM      MDM   Final diagnoses:  Bronchitis    No rales, no wheezing, speaks in full sentences, no peripheral edema, no other signs of DVT, breast exam done under chaperone presence, no signs of masses redness bruising swelling abscesses or otherwise. The patient appears stable to follow up. She will be started on medications as below, she has been given instructions on return, expresses her understanding.  Meds given in ED:  Medications  ketorolac (TORADOL) injection 60 mg (not administered)    New Prescriptions   AZITHROMYCIN (ZITHROMAX Z-PAK) 250 MG TABLET    Take 1 tablet (250 mg total) by mouth daily. 500mg  PO day 1, then 250mg   PO days 205   BENZONATATE (TESSALON) 100 MG CAPSULE    Take 1 capsule (100 mg total) by mouth every 8 (eight) hours.   PREDNISONE (DELTASONE) 20 MG TABLET    Take 2 tablets (40 mg total) by mouth daily.   TRAMADOL (ULTRAM) 50 MG TABLET    Take 1 tablet (50 mg total) by mouth every 6 (six) hours as needed.      Eber HongBrian Terrel Nesheiwat, MD 03/23/15 [phone]

## 2015-03-23 NOTE — ED Notes (Signed)
Pt alert & oriented x4, stable gait. Patient given discharge instructions, paperwork & prescription(s). Patient  instructed to stop at the registration desk to finish any additional paperwork. Patient verbalized understanding. Pt left department w/ no further questions. 

## 2015-04-01 ENCOUNTER — Ambulatory Visit (HOSPITAL_COMMUNITY)
Admission: RE | Admit: 2015-04-01 | Discharge: 2015-04-01 | Disposition: A | Discharge status: Home / Self Care | Payer: Non-veteran care | Source: Ambulatory Visit | Attending: Nurse Practitioner | Admitting: Nurse Practitioner

## 2015-04-01 DIAGNOSIS — Z1231 Encounter for screening mammogram for malignant neoplasm of breast: Secondary | ICD-10-CM

## 2015-04-03 ENCOUNTER — Ambulatory Visit (INDEPENDENT_AMBULATORY_CARE_PROVIDER_SITE_OTHER): Admitting: Psychiatry

## 2015-04-03 ENCOUNTER — Encounter (HOSPITAL_COMMUNITY): Payer: Self-pay | Admitting: Psychiatry

## 2015-04-03 VITALS — BP 128/57 | HR 70 | Ht 63.0 in | Wt 169.2 lb

## 2015-04-03 DIAGNOSIS — F331 Major depressive disorder, recurrent, moderate: Secondary | ICD-10-CM | POA: Diagnosis not present

## 2015-04-03 DIAGNOSIS — F329 Major depressive disorder, single episode, unspecified: Secondary | ICD-10-CM | POA: Insufficient documentation

## 2015-04-03 MED ORDER — LAMOTRIGINE 200 MG PO TABS: 200.0000 mg | ORAL_TABLET | Freq: Every day | ORAL | Status: DC

## 2015-04-03 MED ORDER — CLONAZEPAM 0.5 MG PO TABS: 0.5000 mg | ORAL_TABLET | Freq: Three times a day (TID) | ORAL | Status: DC

## 2015-04-03 MED ORDER — DULOXETINE HCL 60 MG PO CPEP: 60.0000 mg | ORAL_CAPSULE | Freq: Two times a day (BID) | ORAL | Status: AC

## 2015-04-03 NOTE — Progress Notes (Signed)
Psychiatric Initial Adult Assessment   Patient Identification: Alexis Munoz MRN:  [id number] Date of Evaluation:  04/03/2015 Referral Source: [facility] Chief Complaint:   Chief Complaint    Depression; Anxiety; Follow-up     Visit Diagnosis:    ICD-9-CM ICD-10-CM   1. Major depressive disorder, recurrent episode, moderate 296.32 F33.1    Diagnosis:   Patient Active Problem List   Diagnosis Date Noted  . Major depression [F32.2] 04/03/2015   History of Present Illness:  This patient is a 59 year old widowed white female who lives in [place] with her sister and her daughter. She has 2 daughters She is currently unemployed and is applying for disability. She moved to [place] from [place] 10 months ago to help out her sister who has chronic illness  The patient was [facility] by [facility] for further assessment of depression anxiety and possible bipolar disorder.  The patient states that her difficulties with mental illness started in 04-Mar-2004. At that time her oldest daughter's boyfriend was being very mean to her. He was not allowing her to see her daughter or her granddaughter. This went on for 2 years and she got very distraught. She was depressed anxious and developed panic attacks. She was living in [place] the time and saw psychiatrist there and eventually was managed by family physician. She was told she was bipolar but she's never had severe manic symptoms but does have mood swings and irritability. The patient states that she did well on a combination of Lamictal Cymbalta and either Xanax or Klonopin.  The patient moved down here to be with her sister last August. She's had difficulty finding a new psychiatrist to take her insurance. She's ran out of her medicines several times. For a while she turned to alcohol. She actually came in to our ED and December intoxicated stating that she was using  alcohol to self medicate. I explained to her that we cannot prescribe psychiatric medicines to people that are abusing alcohol because of the interactions and she understands. She claims she drinks 2 or 3 beers on weekends when she watches NASCAR.  Currently the patient states that she is still somewhat depressed, she has been out of her Lamictal and Xanax and feels quite anxious. It's difficult for her to go to sleep, her energy is poor and she is eating fairly well. She denies auditory or visual hallucinations or paranoia. She spends her time doing housework or shopping. She claims she doesn't drink much at this point and does not use drugs. She denies any history of trauma or abuse but states that both parents were alcoholics and she was raised by paternal grandparents. It is always bothered her that she was "given up" by her parents. Her husband died in 03-05-1995 of cancer and she misses him Elements:  Location:  Global. Quality:  Worsening. Severity:  Moderate. Timing:  Constant. Duration:  10 years. Context:  Family issues. Associated Signs/Symptoms: Depression Symptoms:  depressed mood, anhedonia, insomnia, psychomotor retardation, fatigue, anxiety, panic attacks, loss of energy/fatigue, disturbed sleep, (Hypo) Manic Symptoms:  Irritable Mood, Labiality of Mood, Anxiety Symptoms:  Excessive Worry, Panic Symptoms,  Past Medical History:  Past Medical History  Diagnosis Date  . Arthritis   . Sleep apnea   . COPD (chronic obstructive pulmonary disease)   . Restless leg   . Diabetes mellitus without complication   . Mood disorder   . Anxiety   . Depression   .  Diabetes mellitus, type II   . Coronary artery disease   . Thyroid disease     Past Surgical History  Procedure Laterality Date  . Neck surgery    . Carpal tunnel release    . Tubal ligation    . Breast surgery    . Cardiac catheterization     Family History:  Family History  Problem Relation Age of Onset  .  Cancer Mother   . Alcohol abuse Mother   . Diabetes Father   . Cancer Father   . Depression Father   . Alcohol abuse Father   . Diabetes Sister   . Heart failure Sister   . Schizophrenia Sister   . Depression Sister   . Cancer Brother   . Schizophrenia Other    Social History:   History   Social History  . Marital Status: Widowed    Spouse Name: N/A  . Number of Children: N/A  . Years of Education: N/A   Social History Main Topics  . Smoking status: Current Every Day Smoker -- 0.50 packs/day    Types: Cigarettes  . Smokeless tobacco: Never Used  . Alcohol Use: Yes     Comment: Drinks 3 days a week; 5-6 16oz [place] , 04-03-15 per pt occa.  . Drug Use: No  . Sexual Activity: No   Other Topics Concern  . None   Social History Narrative   Additional Social History: The patient grew up in [place]. Her parents are both alcoholics. She has 10 siblings but several were raised in foster care. She was raised by paternal grandparents. She quit school in the 12th grade. She was married soon afterwards and has 2 daughters. Her husband was in the [employer] but he died in 1995-02-27 of cancer. She has worked primarily in hospitals as a Psychologist, educational in the ER. She has not worked since 2006/02/26 due to problems with degenerative disc disease and cardiac problems. She denies any history of trauma or abuse. She moved to [place] from [place] last year to help take care of her sister. She also really had nowhere to stay  Musculoskeletal: Strength & Muscle Tone: within normal limits Gait & Station: normal Patient leans: N/A  Psychiatric Specialty Exam: HPI  Review of Systems  Constitutional: Positive for malaise/fatigue.  Musculoskeletal: Positive for myalgias, back pain and neck pain.  Psychiatric/Behavioral: Positive for depression. The patient is nervous/anxious and has insomnia.   All other systems reviewed and are negative.   Blood pressure 128/57, pulse 70, height   (1.6 m), weight 169 lb 3.2 oz (76.749 kg).Body mass index is 29.98 kg/(m^2).  General Appearance: Casual, Neat and Well Groomed  Eye Contact:  Good  Speech:  Clear and Coherent  Volume:  Normal  Mood:  Anxious and Dysphoric  Affect:  Depressed  Thought Process:  Goal Directed  Orientation:  Full (Time, Place, and Person)  Thought Content:  Rumination  Suicidal Thoughts:  No  Homicidal Thoughts:  No  Memory:  Immediate;   Fair Recent;   Fair Remote;   Fair  Judgement:  Fair  Insight:  Fair  Psychomotor Activity:  Normal  Concentration:  Good  Recall:  [employer] of Knowledge:Fair  Language: Good  Akathisia:  No  Handed:  Right  AIMS (if indicated):    Assets:  Communication Skills Desire for Improvement Resilience Talents/Skills  ADL's:  Intact  Cognition: WNL  Sleep:  poor   Is the patient at risk to self?  No. Has the patient been a risk to self in the past 6 months?  No. Has the patient been a risk to self within the distant past?  No. Is the patient a risk to others?  No. Has the patient been a risk to others in the past 6 months?  No. Has the patient been a risk to others within the distant past?  No.  Allergies:   Allergies  Allergen Reactions  . Sulfa Antibiotics Nausea Only  . Amoxicillin Rash   Current Medications: Current Outpatient Prescriptions  Medication Sig Dispense Refill  . benzonatate (TESSALON) 100 MG capsule Take 1 capsule (100 mg total) by mouth every 8 (eight) hours. 21 capsule 0  . budesonide-formoterol (SYMBICORT) 160-4.5 MCG/ACT inhaler Inhale 2 puffs into the lungs 2 (two) times daily. 1 Inhaler 12  . DULoxetine (CYMBALTA) 60 MG capsule Take 1 capsule (60 mg total) by mouth 2 (two) times daily. 180 capsule 2  . HYDROcodone-acetaminophen (NORCO/VICODIN) 5-325 MG per tablet Take 1 tablet by mouth every 4 (four) hours as needed. 20 tablet 0  . ipratropium-albuterol (DUONEB) 0.5-2.5 (3) MG/3ML SOLN Take 3 mLs by nebulization 4 (four) times  daily.    Marland Kitchen levothyroxine (SYNTHROID, LEVOTHROID) 50 MCG tablet Take 50 mcg by mouth daily before breakfast.    . metoprolol succinate (TOPROL-XL) 25 MG 24 hr tablet Take 12.5 mg by mouth at bedtime.    . midodrine (PROAMATINE) 5 MG tablet Take 5 mg by mouth 2 (two) times daily with a meal.    . pravastatin (PRAVACHOL) 40 MG tablet Take 40 mg by mouth at bedtime.    Marland Kitchen rOPINIRole (REQUIP) 1 MG tablet Take 1 mg by mouth at bedtime.    . traMADol (ULTRAM) 50 MG tablet Take 1 tablet (50 mg total) by mouth every 6 (six) hours as needed. 15 tablet 0  . vitamin B-12 (CYANOCOBALAMIN) 500 MCG tablet Take 500 mcg by mouth daily.    . clonazePAM (KLONOPIN) 0.5 MG tablet Take 1 tablet (0.5 mg total) by mouth 3 (three) times daily. 270 tablet 1  . lamoTRIgine (LAMICTAL) 200 MG tablet Take 1 tablet (200 mg total) by mouth at bedtime. 30 tablet 2   No current facility-administered medications for this visit.    Previous Psychotropic Medications: Yes   Substance Abuse History in the last 12 months:  Yes.    Consequences of Substance Abuse: Negative  Medical Decision Making:  Review or order clinical lab tests (1), Review and summation of old records (2), Established Problem, Worsening (2), Review of Medication Regimen & Side Effects (2) and Review of New Medication or Change in Dosage (2)  Treatment Plan Summary: Medication management The patient seems to have a history of significant depression and anxiety. Her affect actually was fairly bright today and was not congruent with some of the symptoms that she described. I was very concerned about her intoxication a few months ago and explained that we cannot combine alcohol with these psychiatric medications and she voices understanding. We will do a complete toxicology screen today.  The patient will continue Cymbalta for depressed mood, Lamictal for mood stabilization. She requests getting off Xanax and I've switched her to clonazepam 0.5 mg 3 times a day  for anxiety. She'll return to see me in 4 weeks and also be set up to see a counselor here. She may call at any time if her symptoms worsen   Aisha Greenberger, [facility] 6/17/20169:39 AM

## 2015-04-09 ENCOUNTER — Telehealth (HOSPITAL_COMMUNITY): Payer: Self-pay | Admitting: *Deleted

## 2015-04-09 NOTE — Telephone Encounter (Signed)
Tried calling pt at [phone] but number is not working. Will try back at another time

## 2015-04-09 NOTE — Telephone Encounter (Signed)
please have Lajoyce Corners speak with her regarding Cymbalta

## 2015-04-10 ENCOUNTER — Telehealth (HOSPITAL_COMMUNITY): Payer: Self-pay | Admitting: *Deleted

## 2015-04-10 NOTE — Telephone Encounter (Signed)
Pt called wondering if it was safe for her to take Vestoril and Klonopin together. Per pt she wanted to know what Dr. Ross thought able it. Pt number is [phone]. 

## 2015-04-10 NOTE — Telephone Encounter (Signed)
Spoke with pt and she showed understanding 

## 2015-04-10 NOTE — Telephone Encounter (Signed)
Pt called wondering if it was safe for her to take Vestoril and Klonopin together. Per pt she wanted to know what Dr. Tenny Craw thought able it. Pt number is [phone].

## 2015-04-10 NOTE — Telephone Encounter (Signed)
LMTCB

## 2015-04-10 NOTE — Telephone Encounter (Signed)
Tell her to avoid doing this because both cause drowsiness

## 2015-04-23 ENCOUNTER — Ambulatory Visit (HOSPITAL_COMMUNITY): Payer: Self-pay | Admitting: Psychiatry

## 2015-04-27 ENCOUNTER — Ambulatory Visit (HOSPITAL_COMMUNITY): Payer: Self-pay | Admitting: Psychiatry

## 2015-04-29 ENCOUNTER — Ambulatory Visit (HOSPITAL_COMMUNITY): Payer: Self-pay | Admitting: Psychiatry

## 2015-05-01 ENCOUNTER — Ambulatory Visit (HOSPITAL_COMMUNITY): Payer: Self-pay | Admitting: Psychiatry

## 2015-05-18 ENCOUNTER — Telehealth: Payer: Self-pay | Admitting: Cardiovascular Disease

## 2015-05-18 NOTE — Telephone Encounter (Signed)
Received records from [facility] for appointment on 05/22/15 with Dr Allyson Sabal.  Records given to [facility] (medical records) for Dr Hazle Coca schedule on 05/22/15. lp

## 2015-05-19 ENCOUNTER — Ambulatory Visit (HOSPITAL_COMMUNITY): Payer: Self-pay | Admitting: Psychiatry

## 2015-05-19 ENCOUNTER — Encounter: Payer: Self-pay | Admitting: Psychiatry

## 2015-05-19 ENCOUNTER — Telehealth (HOSPITAL_COMMUNITY): Payer: Self-pay | Admitting: *Deleted

## 2015-05-19 NOTE — Telephone Encounter (Signed)
Pt had lm on office voicemail 05-18-15 night wanting to cancel apt for today 05-19-15 due to having to take care of her sick sister. Office call pt 05-19-15 at 8:35am and lm for pt to call back.Number provided.

## 2015-05-22 ENCOUNTER — Encounter: Payer: Self-pay | Admitting: Cardiovascular Disease

## 2015-05-22 ENCOUNTER — Ambulatory Visit (INDEPENDENT_AMBULATORY_CARE_PROVIDER_SITE_OTHER): Admitting: Cardiovascular Disease

## 2015-05-22 VITALS — BP 132/77 | HR 67 | Ht 63.0 in | Wt 172.4 lb

## 2015-05-22 DIAGNOSIS — E785 Hyperlipidemia, unspecified: Secondary | ICD-10-CM | POA: Diagnosis not present

## 2015-05-22 DIAGNOSIS — I10 Essential (primary) hypertension: Secondary | ICD-10-CM | POA: Insufficient documentation

## 2015-05-22 NOTE — Patient Instructions (Signed)
Follow up with Dr Berry as needed.  

## 2015-05-22 NOTE — Assessment & Plan Note (Signed)
History of hypertension blood pressure measured at 132/77. She is on metoprolol. Continue current meds at current dosing

## 2015-05-22 NOTE — Assessment & Plan Note (Signed)
History of hyperlipidemia on pravastatin followed by her PCP 

## 2015-05-22 NOTE — Progress Notes (Signed)
05/22/2015 Alexis Munoz   30-Apr-1956  [id number]  Primary Physician Alexis Radar, MD Primary Cardiologist: Alexis Gess MD Alexis Munoz   HPI:  Alexis Munoz is a 59 year old mildly overweight widowed Caucasian female mother of 2 children, grandmother and 6 grandchildren referred for peripheral vascular evaluation. Her cardiovascular risk factor profile is notable for 20-30 pack years of tobacco abuse, treated hypertension and hyperlipidemia. She's never had a heart attack or stroke. She underwent cardiac catheterization in [place] in 2008 and was told she had "clean coronary arteries. She gets occasional palpitations and dyspnea. She has pain in her legs all the time and had Dopplers performed at outside tissue revealing normal ABIs and TBI's. On exam today her pulses are 2+.   Current Outpatient Prescriptions  Medication Sig Dispense Refill  . budesonide-formoterol (SYMBICORT) 160-4.5 MCG/ACT inhaler Inhale 2 puffs into the lungs 2 (two) times daily. 1 Inhaler 12  . clonazePAM (KLONOPIN) 0.5 MG tablet Take 1 tablet (0.5 mg total) by mouth 3 (three) times daily. 270 tablet 1  . DULoxetine (CYMBALTA) 60 MG capsule Take 1 capsule (60 mg total) by mouth 2 (two) times daily. 180 capsule 2  . ipratropium-albuterol (DUONEB) 0.5-2.5 (3) MG/3ML SOLN Take 3 mLs by nebulization 4 (four) times daily.    Marland Kitchen lamoTRIgine (LAMICTAL) 200 MG tablet Take 1 tablet (200 mg total) by mouth at bedtime. 30 tablet 2  . levothyroxine (SYNTHROID, LEVOTHROID) 50 MCG tablet Take 50 mcg by mouth daily before breakfast.    . LYRICA 25 MG capsule Take 25 mg by mouth 3 (three) times daily.  0  . metoprolol succinate (TOPROL-XL) 25 MG 24 hr tablet Take 12.5 mg by mouth at bedtime.    . midodrine (PROAMATINE) 5 MG tablet Take 5 mg by mouth 2 (two) times daily with a meal.    . pravastatin (PRAVACHOL) 40 MG tablet Take 40 mg by mouth at bedtime.    Marland Kitchen rOPINIRole (REQUIP) 1 MG tablet Take 1 mg by  mouth at bedtime.    . vitamin B-12 (CYANOCOBALAMIN) 500 MCG tablet Take 500 mcg by mouth daily.     No current facility-administered medications for this visit.    Allergies  Allergen Reactions  . Clarithromycin Nausea And Vomiting  . Sulfa Antibiotics Nausea Only  . Amoxicillin Rash    History   Social History  . Marital Status: Widowed    Spouse Name: N/A  . Number of Children: N/A  . Years of Education: N/A   Occupational History  . Not on file.   Social History Main Topics  . Smoking status: Current Every Day Smoker -- 0.50 packs/day    Types: Cigarettes  . Smokeless tobacco: Never Used  . Alcohol Use: Yes     Comment: Drinks 3 days a week; 5-6 16oz Sparks , 04-03-15 per pt occa.  . Drug Use: No  . Sexual Activity: No   Other Topics Concern  . Not on file   Social History Narrative     Review of Systems: General: negative for chills, fever, night sweats or weight changes.  Cardiovascular: negative for chest pain, dyspnea on exertion, edema, orthopnea, palpitations, paroxysmal nocturnal dyspnea or shortness of breath Dermatological: negative for rash Respiratory: negative for cough or wheezing Urologic: negative for hematuria Abdominal: negative for nausea, vomiting, diarrhea, bright red blood per rectum, melena, or hematemesis Neurologic: negative for visual changes, syncope, or dizziness All other systems reviewed and are otherwise negative except as noted above.  Blood pressure 132/77, pulse 67, height 5\' 3"  (1.6 m), weight 172 lb 6.4 oz (78.2 kg).  General appearance: alert and no distress Neck: no adenopathy, no carotid bruit, no JVD, supple, symmetrical, trachea midline and thyroid not enlarged, symmetric, no tenderness/mass/nodules Lungs: clear to auscultation bilaterally Heart: regular rate and rhythm, S1, S2 normal, no murmur, click, rub or gallop Extremities: extremities normal, atraumatic, no cyanosis or edema  EKG not performed  today  ASSESSMENT AND PLAN:   Hyperlipidemia History of hyperlipidemia on pravastatin followed by her PCP  Essential hypertension History of hypertension blood pressure measured at 132/77. She is on metoprolol. Continue current meds at current dosing      Alexis Gess MD [facility] 05/22/2015 2:47 PM

## 2015-05-26 ENCOUNTER — Encounter: Payer: Self-pay | Admitting: Cardiovascular Disease

## 2015-06-01 ENCOUNTER — Encounter (HOSPITAL_COMMUNITY): Payer: Self-pay | Admitting: Emergency Medicine

## 2015-06-01 ENCOUNTER — Emergency Department (HOSPITAL_COMMUNITY)
Admission: EM | Admit: 2015-06-01 | Discharge: 2015-06-02 | Disposition: A | Discharge status: Home / Self Care | Attending: Emergency Medicine | Admitting: Emergency Medicine

## 2015-06-01 DIAGNOSIS — E079 Disorder of thyroid, unspecified: Secondary | ICD-10-CM | POA: Insufficient documentation

## 2015-06-01 DIAGNOSIS — Z720 Tobacco use: Secondary | ICD-10-CM | POA: Diagnosis not present

## 2015-06-01 DIAGNOSIS — Z7951 Long term (current) use of inhaled steroids: Secondary | ICD-10-CM | POA: Diagnosis not present

## 2015-06-01 DIAGNOSIS — Z79899 Other long term (current) drug therapy: Secondary | ICD-10-CM | POA: Insufficient documentation

## 2015-06-01 DIAGNOSIS — G2581 Restless legs syndrome: Secondary | ICD-10-CM | POA: Diagnosis not present

## 2015-06-01 DIAGNOSIS — F1010 Alcohol abuse, uncomplicated: Secondary | ICD-10-CM | POA: Diagnosis not present

## 2015-06-01 DIAGNOSIS — F419 Anxiety disorder, unspecified: Secondary | ICD-10-CM | POA: Insufficient documentation

## 2015-06-01 DIAGNOSIS — Z8739 Personal history of other diseases of the musculoskeletal system and connective tissue: Secondary | ICD-10-CM | POA: Insufficient documentation

## 2015-06-01 DIAGNOSIS — F329 Major depressive disorder, single episode, unspecified: Secondary | ICD-10-CM | POA: Diagnosis not present

## 2015-06-01 DIAGNOSIS — Z880 Allergy status to penicillin: Secondary | ICD-10-CM | POA: Diagnosis not present

## 2015-06-01 DIAGNOSIS — J449 Chronic obstructive pulmonary disease, unspecified: Secondary | ICD-10-CM | POA: Diagnosis not present

## 2015-06-01 DIAGNOSIS — E119 Type 2 diabetes mellitus without complications: Secondary | ICD-10-CM | POA: Diagnosis not present

## 2015-06-01 DIAGNOSIS — M545 Low back pain: Secondary | ICD-10-CM | POA: Insufficient documentation

## 2015-06-01 DIAGNOSIS — I2510 Atherosclerotic heart disease of native coronary artery without angina pectoris: Secondary | ICD-10-CM | POA: Insufficient documentation

## 2015-06-01 DIAGNOSIS — Z008 Encounter for other general examination: Secondary | ICD-10-CM | POA: Diagnosis present

## 2015-06-01 LAB — CBC WITH DIFFERENTIAL/PLATELET
Basophils Absolute: 0.1 10*3/uL (ref 0.0–0.1)
Basophils Relative: 1 % (ref 0–1)
Eosinophils Absolute: 0.3 10*3/uL (ref 0.0–0.7)
Eosinophils Relative: 4 % (ref 0–5)
HEMATOCRIT: 44.5 % (ref 36.0–46.0)
HEMOGLOBIN: 14.6 g/dL (ref 12.0–15.0)
LYMPHS ABS: 3.7 10*3/uL (ref 0.7–4.0)
Lymphocytes Relative: 43 % (ref 12–46)
MCH: 31.7 pg (ref 26.0–34.0)
MCHC: 32.8 g/dL (ref 30.0–36.0)
MCV: 96.7 fL (ref 78.0–100.0)
MONO ABS: 0.5 10*3/uL (ref 0.1–1.0)
MONOS PCT: 6 % (ref 3–12)
NEUTROS ABS: 4 10*3/uL (ref 1.7–7.7)
NEUTROS PCT: 46 % (ref 43–77)
Platelets: 264 10*3/uL (ref 150–400)
RBC: 4.6 MIL/uL (ref 3.87–5.11)
RDW: 14.2 % (ref 11.5–15.5)
WBC: 8.5 10*3/uL (ref 4.0–10.5)

## 2015-06-01 LAB — BASIC METABOLIC PANEL
Anion gap: 10 (ref 5–15)
CALCIUM: 8.6 mg/dL — AB (ref 8.9–10.3)
CHLORIDE: 104 mmol/L (ref 101–111)
CO2: 28 mmol/L (ref 22–32)
CREATININE: 0.71 mg/dL (ref 0.44–1.00)
GFR calc Af Amer: >60 mL/min (ref >60)
GFR calc non Af Amer: >60 mL/min (ref >60)
GLUCOSE: 114 mg/dL — AB (ref 65–99)
Potassium: 3.5 mmol/L (ref 3.5–5.1)
Sodium: 142 mmol/L (ref 135–145)

## 2015-06-01 LAB — ETHANOL: ALCOHOL ETHYL (B): 240 mg/dL — AB (ref <5)

## 2015-06-01 MED ORDER — ACETAMINOPHEN 325 MG PO TABS
650.0000 mg | ORAL_TABLET | Freq: Three times a day (TID) | ORAL | Status: DC | PRN
  Administered 2015-06-01: 650 mg via ORAL
  Filled 2015-06-01: qty 2

## 2015-06-01 MED ORDER — IBUPROFEN 400 MG PO TABS
600.0000 mg | ORAL_TABLET | Freq: Four times a day (QID) | ORAL | Status: DC | PRN
  Administered 2015-06-01: 600 mg via ORAL
  Filled 2015-06-01: qty 2

## 2015-06-01 NOTE — ED Notes (Signed)
Pt states she wants help with detox from alcohol. Pt states she drinks everyday.

## 2015-06-01 NOTE — ED Provider Notes (Addendum)
CSN: [id number]     Arrival date & time 06/01/15  2255 History  This chart was scribed for Shon Baton, MD by Budd Palmer, ED Scribe. This patient was seen in room APA17/APA17 and the patient's care was started at 11:35 PM.     Chief Complaint  Patient presents with  . Medical Clearance   The history is provided by the patient. No language interpreter was used.   HPI Comments: Alexis Munoz is a 59 y.o. female smoker who presents to the Emergency Department to detox from alcohol. She reports drinking 5-6 Smirnoff drinks every day. She has never gone though withdrawal. She has never gone to rehab before. She fell while walking this evening but did not hit her head or lose consciousness. She has a PMHx of a pinched nerve for which she was taking Lyrica, but has run out. She takes Klonopin which she has been out of since today. She has a PMHx of sleep apnea.She denies using any illegal drugs. Pt denies SI and HI. She is allergic to amoxicillin and sulfa drugs.  Past Medical History  Diagnosis Date  . Arthritis   . Sleep apnea   . COPD (chronic obstructive pulmonary disease)     ongoing tobacco abuse  . Restless leg   . Diabetes mellitus without complication   . Mood disorder   . Anxiety   . Depression   . Diabetes mellitus, type II   . Coronary artery disease   . Thyroid disease    Past Surgical History  Procedure Laterality Date  . Neck surgery    . Carpal tunnel release    . Tubal ligation    . Breast surgery    . Cardiac catheterization     Family History  Problem Relation Age of Onset  . Cancer Mother   . Alcohol abuse Mother   . Diabetes Father   . Cancer Father   . Depression Father   . Alcohol abuse Father   . Diabetes Sister   . Heart failure Sister   . Schizophrenia Sister   . Depression Sister   . Cancer Brother   . Schizophrenia Other    Social History  Substance Use Topics  . Smoking status: Current Every Day Smoker -- 0.50 packs/day   Types: Cigarettes  . Smokeless tobacco: Never Used  . Alcohol Use: Yes     Comment: Drinks 3 days a week; 5-6 16oz Sparks , 04-03-15 per pt occa.   OB History    Gravida Para Term Preterm AB TAB SAB Ectopic Multiple Living   2 2 2             Review of Systems  Constitutional: Negative for fever.  Respiratory: Negative for chest tightness and shortness of breath.   Cardiovascular: Negative for chest pain.  Gastrointestinal: Negative for nausea, vomiting and abdominal pain.  Genitourinary: Negative for dysuria.  Musculoskeletal: Positive for back pain.  Skin: Positive for wound.  Psychiatric/Behavioral: Negative for suicidal ideas, confusion and self-injury.  All other systems reviewed and are negative.   Allergies  Clarithromycin; Sulfa antibiotics; and Amoxicillin  Home Medications   Prior to Admission medications   Medication Sig Start Date End Date Taking? Authorizing Provider  budesonide-formoterol (SYMBICORT) 160-4.5 MCG/ACT inhaler Inhale 2 puffs into the lungs 2 (two) times daily. 08/27/14   Burgess Amor, PA-C  clonazePAM (KLONOPIN) 0.5 MG tablet Take 1 tablet (0.5 mg total) by mouth 3 (three) times daily. 04/03/15 04/02/16  Myrlene Broker, MD  DULoxetine (CYMBALTA) 60 MG capsule Take 1 capsule (60 mg total) by mouth 2 (two) times daily. 04/03/15   Myrlene Broker, MD  ipratropium-albuterol (DUONEB) 0.5-2.5 (3) MG/3ML SOLN Take 3 mLs by nebulization 4 (four) times daily.    Historical Provider, MD  lamoTRIgine (LAMICTAL) 200 MG tablet Take 1 tablet (200 mg total) by mouth at bedtime. 04/03/15   Myrlene Broker, MD  levothyroxine (SYNTHROID, LEVOTHROID) 50 MCG tablet Take 50 mcg by mouth daily before breakfast.    Historical Provider, MD  LYRICA 25 MG capsule Take 25 mg by mouth 3 (three) times daily. 05/01/15   Historical Provider, MD  metoprolol succinate (TOPROL-XL) 25 MG 24 hr tablet Take 12.5 mg by mouth at bedtime.    Historical Provider, MD  midodrine (PROAMATINE) 5 MG tablet  Take 5 mg by mouth 2 (two) times daily with a meal.    Historical Provider, MD  pravastatin (PRAVACHOL) 40 MG tablet Take 40 mg by mouth at bedtime.    Historical Provider, MD  rOPINIRole (REQUIP) 1 MG tablet Take 1 mg by mouth at bedtime.    Historical Provider, MD  vitamin B-12 (CYANOCOBALAMIN) 500 MCG tablet Take 500 mcg by mouth daily.    Historical Provider, MD   BP 119/61 mmHg  Pulse 76  Temp(Src) 98.1 F (36.7 C)  Resp 18  SpO2 95% Physical Exam  Constitutional: She is oriented to person, place, and time.  Tearful, no acute distress  HENT:  Head: Normocephalic and atraumatic.  Conjunctiva injected  Eyes: Pupils are equal, round, and reactive to light.  Cardiovascular: Normal rate, regular rhythm and normal heart sounds.   No murmur heard. Pulmonary/Chest: Effort normal and breath sounds normal. No respiratory distress. She has no wheezes.  Abdominal: Soft. Bowel sounds are normal. There is no tenderness. There is no rebound.  Musculoskeletal:  No obvious deformities  Neurological: She is alert and oriented to person, place, and time.  Skin: Skin is warm and dry.  Superficial abrasion to right distal forearm  Nursing note and vitals reviewed.   ED Course  Procedures  DIAGNOSTIC STUDIES: Oxygen Saturation is 97% on RA, adequate by my interpretation.    COORDINATION OF CARE: 11:40 PM - Discussed plans to order diagnostic studies and a  tele-psych. Pt advised of plan for treatment and pt agrees.  Labs Review Labs Reviewed  ETHANOL - Abnormal; Notable for the following:    Alcohol, Ethyl (B) 240 (*)    All other components within normal limits  BASIC METABOLIC PANEL - Abnormal; Notable for the following:    Glucose, Bld 114 (*)    BUN <5 (*)    Calcium 8.6 (*)    All other components within normal limits  URINE RAPID DRUG SCREEN, HOSP PERFORMED  CBC WITH DIFFERENTIAL/PLATELET    Imaging Review No results found. I, Tifini Reeder F, personally reviewed and  evaluated these images and lab results as part of my medical decision-making.   EKG Interpretation None      MDM   Final diagnoses:  Alcohol abuse    Patient presents requesting alcohol detox. Nontoxic on exam. Vital signs are reassuring. Reports fall prior to arrival with small abrasion over right arm. No obvious deformities. Lab work obtained.  Alcohol 240. TTS consulted.  I personally performed the services described in this documentation, which was scribed in my presence. The recorded information has been reviewed and is accurate.   Shon Baton, MD 06/02/15 0102  Patient does not meet  inpatient criteria for alcohol detox. Will provide outpatient resources.  Shon Baton, MD 06/02/15 [phone]

## 2015-06-02 ENCOUNTER — Telehealth (HOSPITAL_COMMUNITY): Payer: Self-pay | Admitting: *Deleted

## 2015-06-02 LAB — RAPID URINE DRUG SCREEN, HOSP PERFORMED
AMPHETAMINES: NOT DETECTED
Barbiturates: NOT DETECTED
Benzodiazepines: NOT DETECTED
COCAINE: NOT DETECTED
OPIATES: NOT DETECTED
TETRAHYDROCANNABINOL: NOT DETECTED

## 2015-06-02 MED ORDER — CLONAZEPAM 0.5 MG PO TABS: 0.5000 mg | ORAL_TABLET | Freq: Three times a day (TID) | ORAL | Status: DC

## 2015-06-02 MED ORDER — LORAZEPAM 2 MG/ML IJ SOLN: 0.0000 mg | Freq: Four times a day (QID) | INTRAMUSCULAR | Status: DC

## 2015-06-02 MED ORDER — VITAMIN B-1 100 MG PO TABS: 100.0000 mg | ORAL_TABLET | Freq: Every day | ORAL | Status: DC

## 2015-06-02 MED ORDER — LORAZEPAM 1 MG PO TABS: 0.0000 mg | ORAL_TABLET | Freq: Two times a day (BID) | ORAL | Status: DC

## 2015-06-02 MED ORDER — LORAZEPAM 1 MG PO TABS: 0.0000 mg | ORAL_TABLET | Freq: Four times a day (QID) | ORAL | Status: DC

## 2015-06-02 MED ORDER — LYRICA 25 MG PO CAPS: 25.0000 mg | ORAL_CAPSULE | Freq: Three times a day (TID) | ORAL | Status: DC

## 2015-06-02 MED ORDER — THIAMINE HCL 100 MG/ML IJ SOLN: 100.0000 mg | Freq: Every day | INTRAMUSCULAR | Status: DC

## 2015-06-02 MED ORDER — LORAZEPAM 2 MG/ML IJ SOLN: 0.0000 mg | Freq: Two times a day (BID) | INTRAMUSCULAR | Status: DC

## 2015-06-02 NOTE — Discharge Instructions (Signed)
Alcohol Intoxication  Alcohol intoxication occurs when the amount of alcohol that a person has consumed impairs his or her ability to mentally and physically function. Alcohol directly impairs the normal chemical activity of the brain. Drinking large amounts of alcohol can lead to changes in mental function and behavior, and it can cause many physical effects that can be harmful.   Alcohol intoxication can range in severity from mild to very severe. Various factors can affect the level of intoxication that occurs, such as the person's age, gender, weight, frequency of alcohol consumption, and the presence of other medical conditions (such as diabetes, seizures, or heart conditions). Dangerous levels of alcohol intoxication may occur when people drink large amounts of alcohol in a short period (binge drinking). Alcohol can also be especially dangerous when combined with certain prescription medicines or "recreational" drugs.  SIGNS AND SYMPTOMS  Some common signs and symptoms of mild alcohol intoxication include:  · Loss of coordination.  · Changes in mood and behavior.  · Impaired judgment.  · Slurred speech.  As alcohol intoxication progresses to more severe levels, other signs and symptoms will appear. These may include:  · Vomiting.  · Confusion and impaired memory.  · Slowed breathing.  · Seizures.  · Loss of consciousness.  DIAGNOSIS   Your health care provider will take a medical history and perform a physical exam. You will be asked about the amount and type of alcohol you have consumed. Blood tests will be done to measure the concentration of alcohol in your blood. In many places, your blood alcohol level must be lower than 80 mg/dL (0.08%) to legally drive. However, many dangerous effects of alcohol can occur at much lower levels.   TREATMENT   People with alcohol intoxication often do not require treatment. Most of the effects of alcohol intoxication are temporary, and they go away as the alcohol naturally  leaves the body. Your health care provider will monitor your condition until you are stable enough to go home. Fluids are sometimes given through an IV access tube to help prevent dehydration.   HOME CARE INSTRUCTIONS  · Do not drive after drinking alcohol.  · Stay hydrated. Drink enough water and fluids to keep your urine clear or pale yellow. Avoid caffeine.    · Only take over-the-counter or prescription medicines as directed by your health care provider.    SEEK MEDICAL CARE IF:   · You have persistent vomiting.    · You do not feel better after a few days.  · You have frequent alcohol intoxication. Your health care provider can help determine if you should see a substance use treatment counselor.  SEEK IMMEDIATE MEDICAL CARE IF:   · You become shaky or tremble when you try to stop drinking.    · You shake uncontrollably (seizure).    · You throw up (vomit) blood. This may be bright red or may look like black coffee grounds.    · You have blood in your stool. This may be bright red or may appear as a black, tarry, bad smelling stool.    · You become lightheaded or faint.    MAKE SURE YOU:   · Understand these instructions.  · Will watch your condition.  · Will get help right away if you are not doing well or get worse.  Document Released: 07/13/2005 Document Revised: 06/05/2013 Document Reviewed: 03/08/2013  [employer]® Patient Information ©2015 [employer]. This information is not intended to replace advice given to you by your health care provider. Make sure   you discuss any questions you have with your health care provider.

## 2015-06-02 NOTE — BH Assessment (Addendum)
Tele Assessment Note   Alexis Munoz is an 59 y.o.single female who came to [facility] tonight brought in by EMS after a call from her sister at the pt's request. Information for this assessment was obtained from pt and hospital records. Pt sts that she believes her alcohol consumption is a big problem and she wants medical help to detox and stop drinking. Pt denies SI, HI, SHI and AVH.  Pt has a hx of diagnoses of Bipolar Disorder and MDD but, does not have symptoms of depression or mania currently.  Pt's BAL tested at 240 and her UDS was negative for all substances. Pt sts that recently she had several health issues come up and she realized that her alcohol consumption was excessive. Pt sts that she drinks 5-6 Smirnoff beverages per day and has since August of 2015. Pt sts that both of her "parents were alcoholics." Pt has no symptoms of depression or anxiety currently.  Pt sts she moved in with her sister in August of last year (2015). Pt sts she graduated high school and attended 2 years of college. 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 Pt sts she has never attempted suicide and never had that urge.  Pt sts she has had no problems with anger or aggression in the past. Pt sts she has not experienced physical, sexual or emotional/verbal abuse.  Pt sts she sleeps about 5-6 hours a night and eats well. Pt sts she has never been IP for MH reasons and has not seen an OPT. Pt sts she has no legal issues past or present.   Pt was alert, cooperative and pleasant during the assessment.  Pt spoke and moved in normal manner. Pt' thought processes were coherent and relevant. Pt's mood was pleasant but concerned and her blunt affect was appropriate and congruent. Pt was oriented x 4.   Axis I: Bipolar by hx; MDD by hx; Alcohol Abuse by hx Axis II: Deferred Axis III:  Past Medical History  Diagnosis Date  . Arthritis   . Sleep apnea   . COPD (chronic obstructive  pulmonary disease)     ongoing tobacco abuse  . Restless leg   . Diabetes mellitus without complication   . Mood disorder   . Anxiety   . Depression   . Diabetes mellitus, type II   . Coronary artery disease   . Thyroid disease    Axis IV: housing problems, occupational problems, other psychosocial or environmental problems, problems related to social environment, problems with access to health care services and problems with primary support group Axis V: 41-50 serious symptoms  Past Medical History:  Past Medical History  Diagnosis Date  . Arthritis   . Sleep apnea   . COPD (chronic obstructive pulmonary disease)     ongoing tobacco abuse  . Restless leg   . Diabetes mellitus without complication   . Mood disorder   . Anxiety   . Depression   . Diabetes mellitus, type II   . Coronary artery disease   . Thyroid disease     Past Surgical History  Procedure Laterality Date  . Neck surgery    . Carpal tunnel release    . Tubal ligation    . Breast surgery    . Cardiac catheterization      Family History:  Family History  Problem Relation Age of Onset  . Cancer Mother   . Alcohol abuse Mother   .  Diabetes Father   . Cancer Father   . Depression Father   . Alcohol abuse Father   . Diabetes Sister   . Heart failure Sister   . Schizophrenia Sister   . Depression Sister   . Cancer Brother   . Schizophrenia Other     Social History:  reports that she has been smoking Cigarettes.  She has been smoking about 0.50 packs per day. She has never used smokeless tobacco. She reports that she drinks alcohol. She reports that she does not use illicit drugs.  Additional Social History:  Alcohol / Drug Use Prescriptions: See PTA list History of alcohol / drug use?: Yes Longest period of sobriety (when/how long): "don't know" Substance #1 Name of Substance 1: Alcohol 1 - Age of First Use: 19 1 - Amount (size/oz): 5-6 Smirnoff beverages 1 - Frequency: daily 1 - Duration:  since August 2015 1 - Last Use / Amount: today 06/01/15  CIWA: CIWA-Ar BP: 119/61 mmHg Pulse Rate: 76 COWS:    PATIENT STRENGTHS: (choose at least two) Average or above average intelligence Communication skills Supportive family/friends  Allergies:  Allergies  Allergen Reactions  . Clarithromycin Nausea And Vomiting  . Sulfa Antibiotics Nausea Only  . Amoxicillin Rash    Home Medications:  (Not in a hospital admission)  OB/GYN Status:  No LMP recorded. Patient is postmenopausal.  General Assessment Data Location of Assessment: [facility] TTS Assessment: In system Is this a Tele or Face-to-Face Assessment?: Tele Assessment Is this an Initial Assessment or a Re-assessment for this encounter?: Initial Assessment Marital status: Single Maiden name: na Is patient pregnant?: No Pregnancy Status: No Living Arrangements: Non-relatives/Friends (lives with sister) Can pt return to current living arrangement?: Yes Admission Status: Voluntary Is patient capable of signing voluntary admission?: Yes Referral Source: Self/Family/Friend Insurance type: CHAMPVA  Medical Screening Exam [facility] Walk-in ONLY) Medical Exam completed: Yes  Crisis Care Plan Living Arrangements: Non-relatives/Friends (lives with sister) Name of Psychiatrist: Dr. Tenny Craw Name of Therapist: none  Education Status Is patient currently in school?: No Current Grade: na Highest grade of school patient has completed: 12 (plus 2 yrs college) Name of school: na Contact person: na  Risk to self with the past 6 months Suicidal Ideation: No Has patient been a risk to self within the past 6 months prior to admission? : No Suicidal Intent: No Has patient had any suicidal intent within the past 6 months prior to admission? : No Is patient at risk for suicide?: No Suicidal Plan?: No Has patient had any suicidal plan within the past 6 months prior to admission? : No Access to Means: No What has been your use of  drugs/alcohol within the last 12 months?: daily use of alcohol Previous Attempts/Gestures: No How many times?: 0 Other Self Harm Risks: none Triggers for Past Attempts:  (na) Intentional Self Injurious Behavior: None Family Suicide History: No Recent stressful life event(s):  (alcohol consumption; Health issues) Persecutory voices/beliefs?: No Depression: No Depression Symptoms: Insomnia Substance abuse history and/or treatment for substance abuse?: Yes Suicide prevention information given to non-admitted patients: Not applicable  Risk to Others within the past 6 months Homicidal Ideation: No Does patient have any lifetime risk of violence toward others beyond the six months prior to admission? : No Thoughts of Harm to Others: No Current Homicidal Intent: No Current Homicidal Plan: No Access to Homicidal Means: No Identified Victim: na History of harm to others?: No Assessment of Violence: None Noted Violent Behavior Description: na Does  patient have access to weapons?: No Criminal Charges Pending?: No Does patient have a court date: No Is patient on probation?: No  Psychosis Hallucinations: None noted Delusions: None noted  Mental Status Report Appearance/Hygiene: Disheveled, In hospital gown, Unremarkable Eye Contact: Good Motor Activity: Freedom of movement, Gestures Speech: Logical/coherent, Unremarkable Level of Consciousness: Quiet/awake Mood: Pleasant, Ashamed/humiliated Affect: Blunted Anxiety Level: None Thought Processes: Coherent, Relevant Judgement: Unimpaired Orientation: Person, Place, Time, Situation Obsessive Compulsive Thoughts/Behaviors: None  Cognitive Functioning Concentration: Good Memory: Recent Intact, Remote Intact IQ: Average Insight: Good Impulse Control: Poor Appetite: Good Weight Loss: 0 Weight Gain: 0 Sleep: No Change Total Hours of Sleep: 5 Vegetative Symptoms: None  ADLScreening [facility] Assessment Services) Patient's cognitive  ability adequate to safely complete daily activities?: Yes Patient able to express need for assistance with ADLs?: Yes Independently performs ADLs?: Yes (appropriate for developmental age)  Prior Inpatient Therapy Prior Inpatient Therapy: No Prior Therapy Dates: na Prior Therapy Facilty/Provider(s): na Reason for Treatment: na  Prior Outpatient Therapy Prior Outpatient Therapy: No Prior Therapy Dates: na Prior Therapy Facilty/Provider(s): na Reason for Treatment: na Does patient have an ACCT team?: No Does patient have Intensive In-House Services?  : No Does patient have [facility] services? : No Does patient have P4CC services?: No  ADL Screening (condition at time of admission) Patient's cognitive ability adequate to safely complete daily activities?: Yes Patient able to express need for assistance with ADLs?: Yes Independently performs ADLs?: Yes (appropriate for developmental age)       Abuse/Neglect Assessment (Assessment to be complete while patient is alone) Physical Abuse: Denies Verbal Abuse: Denies Sexual Abuse: Denies Exploitation of patient/patient's resources: Denies Self-Neglect: Denies     Merchant navy officer (For Healthcare) Does patient have an advance directive?: No Would patient like information on creating an advanced directive?: No - patient declined information    Additional Information 1:1 In Past 12 Months?: No CIRT Risk: No Elopement Risk: No Does patient have medical clearance?: Yes     Disposition:  Disposition Initial Assessment Completed for this Encounter: Yes Disposition of Patient: Other dispositions (Pending review w [facility] Extender) Other disposition(s): Other (Comment)  Per Donell Sievert, PA: Does not meet IP criteria. Recommend discharge with OP resources.  Spoke with Dr. Wilkie Aye at [facility]: Advised of recommendation. She agreed.  Advised I would fax over OP resources for pt.   Beryle Flock, MS, CRC, [facility] Triage Specialist [facility] T 06/02/2015 1:39 AM

## 2015-06-03 ENCOUNTER — Telehealth (HOSPITAL_COMMUNITY): Payer: Self-pay | Admitting: *Deleted

## 2015-06-03 NOTE — Telephone Encounter (Signed)
Pt called stating her pain medicine doctor will only prescribe her Lyrica which is not working for her and they dismissed her for her drinking while on pain meds. Per pt, she is aware she have problems with Drinking and she ended up in the hospital 06-01-15 for that. Per pt, while she was there, she was informed that she was suicidal and per pt she is not. Per pt chart, on 06-02-15, pt called and cancelled appt with Dr. Tenny Craw and with Gigi Gin and did not give a reason as to why she cancelled. Per pt chart, pt have cancelled every appt with Peggy and only saw Dr. Tenny Craw once. Per pt, she is willing to go to AA meetings and would like to know if Dr. Tenny Craw have any ideas as to where she can go. Per pt, she would like to know if Dr. Tenny Craw could still see her although she is going through her drinking problems. Pt number is [phone]

## 2015-06-03 NOTE — Telephone Encounter (Signed)
She needs to go through alcohol detox first. She was given these resources at the hospital yesterday

## 2015-06-04 NOTE — Telephone Encounter (Signed)
Pt is aware of what Dr. Tenny Craw wanted her to do and pt showed understanding. Give pt several number to call to register for classes and pt showed understanding. Informed pt that once she finishes the program, she can fax the information to office and information will be given to Dr. Tenny Craw and then office can move forward.Pt showed understanding.

## 2015-06-09 ENCOUNTER — Ambulatory Visit (HOSPITAL_COMMUNITY): Payer: Self-pay | Admitting: Psychiatry

## 2015-06-10 ENCOUNTER — Ambulatory Visit (HOSPITAL_COMMUNITY): Payer: Self-pay | Admitting: Psychiatry

## 2015-07-23 ENCOUNTER — Other Ambulatory Visit (HOSPITAL_COMMUNITY): Payer: Self-pay | Admitting: Anesthesiology

## 2015-07-23 DIAGNOSIS — R200 Anesthesia of skin: Secondary | ICD-10-CM

## 2015-07-23 DIAGNOSIS — M545 Low back pain, unspecified: Secondary | ICD-10-CM

## 2015-08-05 ENCOUNTER — Encounter (HOSPITAL_COMMUNITY): Payer: Self-pay | Admitting: Emergency Medicine

## 2015-08-05 ENCOUNTER — Emergency Department (HOSPITAL_COMMUNITY)

## 2015-08-05 ENCOUNTER — Emergency Department (HOSPITAL_COMMUNITY)
Admission: EM | Admit: 2015-08-05 | Discharge: 2015-08-05 | Disposition: A | Discharge status: Home / Self Care | Attending: Emergency Medicine | Admitting: Emergency Medicine

## 2015-08-05 DIAGNOSIS — G2581 Restless legs syndrome: Secondary | ICD-10-CM | POA: Insufficient documentation

## 2015-08-05 DIAGNOSIS — S62514A Nondisplaced fracture of proximal phalanx of right thumb, initial encounter for closed fracture: Secondary | ICD-10-CM | POA: Diagnosis not present

## 2015-08-05 DIAGNOSIS — Z79899 Other long term (current) drug therapy: Secondary | ICD-10-CM | POA: Diagnosis not present

## 2015-08-05 DIAGNOSIS — Y998 Other external cause status: Secondary | ICD-10-CM | POA: Insufficient documentation

## 2015-08-05 DIAGNOSIS — Z8739 Personal history of other diseases of the musculoskeletal system and connective tissue: Secondary | ICD-10-CM | POA: Diagnosis not present

## 2015-08-05 DIAGNOSIS — Z7951 Long term (current) use of inhaled steroids: Secondary | ICD-10-CM | POA: Diagnosis not present

## 2015-08-05 DIAGNOSIS — F329 Major depressive disorder, single episode, unspecified: Secondary | ICD-10-CM | POA: Diagnosis not present

## 2015-08-05 DIAGNOSIS — E119 Type 2 diabetes mellitus without complications: Secondary | ICD-10-CM | POA: Insufficient documentation

## 2015-08-05 DIAGNOSIS — E079 Disorder of thyroid, unspecified: Secondary | ICD-10-CM | POA: Insufficient documentation

## 2015-08-05 DIAGNOSIS — F419 Anxiety disorder, unspecified: Secondary | ICD-10-CM | POA: Insufficient documentation

## 2015-08-05 DIAGNOSIS — J449 Chronic obstructive pulmonary disease, unspecified: Secondary | ICD-10-CM | POA: Insufficient documentation

## 2015-08-05 DIAGNOSIS — I2510 Atherosclerotic heart disease of native coronary artery without angina pectoris: Secondary | ICD-10-CM | POA: Insufficient documentation

## 2015-08-05 DIAGNOSIS — Y9389 Activity, other specified: Secondary | ICD-10-CM | POA: Insufficient documentation

## 2015-08-05 DIAGNOSIS — Y9289 Other specified places as the place of occurrence of the external cause: Secondary | ICD-10-CM | POA: Diagnosis not present

## 2015-08-05 DIAGNOSIS — W548XXA Other contact with dog, initial encounter: Secondary | ICD-10-CM | POA: Insufficient documentation

## 2015-08-05 DIAGNOSIS — S6991XA Unspecified injury of right wrist, hand and finger(s), initial encounter: Secondary | ICD-10-CM | POA: Diagnosis present

## 2015-08-05 DIAGNOSIS — Z880 Allergy status to penicillin: Secondary | ICD-10-CM | POA: Insufficient documentation

## 2015-08-05 DIAGNOSIS — S62501A Fracture of unspecified phalanx of right thumb, initial encounter for closed fracture: Secondary | ICD-10-CM

## 2015-08-05 MED ORDER — ACETAMINOPHEN 325 MG PO TABS
650.0000 mg | ORAL_TABLET | Freq: Once | ORAL | Status: AC
  Administered 2015-08-05: 650 mg via ORAL
  Filled 2015-08-05: qty 2

## 2015-08-05 MED ORDER — IBUPROFEN 800 MG PO TABS
800.0000 mg | ORAL_TABLET | Freq: Once | ORAL | Status: AC
  Administered 2015-08-05: 800 mg via ORAL
  Filled 2015-08-05: qty 1

## 2015-08-05 MED ORDER — HYDROCODONE-ACETAMINOPHEN 5-325 MG PO TABS: 1.0000 | ORAL_TABLET | ORAL | Status: DC | PRN

## 2015-08-05 MED ORDER — IBUPROFEN 800 MG PO TABS: 800.0000 mg | ORAL_TABLET | Freq: Three times a day (TID) | ORAL | Status: DC

## 2015-08-05 NOTE — ED Provider Notes (Signed)
Medical screening examination/treatment/procedure(s) were performed by non-physician practitioner and as supervising physician I was immediately available for consultation/collaboration.   EKG Interpretation None        Vanetta MuldersScott Salvador Coupe, MD 08/05/15 1533

## 2015-08-05 NOTE — ED Notes (Signed)
Last Monday was tripped over by her dog-- hurt rt hand

## 2015-08-05 NOTE — ED Provider Notes (Signed)
CSN: [id number]     Arrival date & time 08/05/15  1059 History   First MD Initiated Contact with Patient 08/05/15 1133     Chief Complaint  Patient presents with  . Hand Pain     (Consider location/radiation/quality/duration/timing/severity/associated sxs/prior Treatment) Patient is a 59 y.o. female presenting with hand pain. The history is provided by the patient.  Hand Pain This is a new problem. The current episode started 1 to 4 weeks ago. The problem occurs intermittently. The problem has been gradually worsening. Associated symptoms include arthralgias. Pertinent negatives include no numbness or vomiting. Exacerbated by: gripping and palpation. She has tried nothing for the symptoms. The treatment provided no relief.    Past Medical History  Diagnosis Date  . Arthritis   . Sleep apnea   . COPD (chronic obstructive pulmonary disease) (HCC)     ongoing tobacco abuse  . Restless leg   . Diabetes mellitus without complication (HCC)   . Mood disorder (HCC)   . Anxiety   . Depression   . Diabetes mellitus, type II (HCC)   . Coronary artery disease   . Thyroid disease    Past Surgical History  Procedure Laterality Date  . Neck surgery    . Carpal tunnel release    . Tubal ligation    . Breast surgery    . Cardiac catheterization     Family History  Problem Relation Age of Onset  . Cancer Mother   . Alcohol abuse Mother   . Diabetes Father   . Cancer Father   . Depression Father   . Alcohol abuse Father   . Diabetes Sister   . Heart failure Sister   . Schizophrenia Sister   . Depression Sister   . Cancer Brother   . Schizophrenia Other    Social History  Substance Use Topics  . Smoking status: Current Every Day Smoker -- 0.50 packs/day    Types: Cigarettes  . Smokeless tobacco: Never Used  . Alcohol Use: Yes     Comment: occ   OB History    Gravida Para Term Preterm AB TAB SAB Ectopic Multiple Living   Review of Systems   Gastrointestinal: Negative for vomiting.  Musculoskeletal: Positive for arthralgias.  Neurological: Negative for numbness.  Psychiatric/Behavioral: The patient is nervous/anxious.        Depression  All other systems reviewed and are negative.     Allergies  Clarithromycin; Sulfa antibiotics; and Amoxicillin  Home Medications   Prior to Admission medications   Medication Sig Start Date End Date Taking? Authorizing Provider  budesonide-formoterol (SYMBICORT) 160-4.5 MCG/ACT inhaler Inhale 2 puffs into the lungs 2 (two) times daily. 08/27/14   Burgess Amor, PA-C  clonazePAM (KLONOPIN) 0.5 MG tablet Take 1 tablet (0.5 mg total) by mouth 3 (three) times daily. 06/02/15 06/01/16  Shon Baton, MD  DULoxetine (CYMBALTA) 60 MG capsule Take 1 capsule (60 mg total) by mouth 2 (two) times daily. 04/03/15   Myrlene Broker, MD  ipratropium-albuterol (DUONEB) 0.5-2.5 (3) MG/3ML SOLN Take 3 mLs by nebulization 4 (four) times daily.    Historical Provider, MD  lamoTRIgine (LAMICTAL) 200 MG tablet Take 1 tablet (200 mg total) by mouth at bedtime. 04/03/15   Myrlene Broker, MD  levothyroxine (SYNTHROID, LEVOTHROID) 50 MCG tablet Take 50 mcg by mouth daily before breakfast.    Historical Provider, MD  LYRICA 25 MG capsule Take  1 capsule (25 mg total) by mouth 3 (three) times daily. 06/02/15   Shon Baton, MD  metoprolol succinate (TOPROL-XL) 25 MG 24 hr tablet Take 12.5 mg by mouth at bedtime.    Historical Provider, MD  midodrine (PROAMATINE) 5 MG tablet Take 5 mg by mouth 2 (two) times daily with a meal.    Historical Provider, MD  pravastatin (PRAVACHOL) 40 MG tablet Take 40 mg by mouth at bedtime.    Historical Provider, MD  rOPINIRole (REQUIP) 1 MG tablet Take 1 mg by mouth at bedtime.    Historical Provider, MD  vitamin B-12 (CYANOCOBALAMIN) 500 MCG tablet Take 500 mcg by mouth daily.    Historical Provider, MD   BP 120/71 mmHg  Pulse 64  Temp(Src) 97.8 F (36.6 C) (Oral)  Ht  (1.575  m)  Wt 177 lb (80.287 kg)  BMI 32.37 kg/m2  SpO2 99% Physical Exam  Constitutional: She is oriented to person, place, and time. She appears well-developed and well-nourished.  Non-toxic appearance.  HENT:  Head: Normocephalic.  Right Ear: Tympanic membrane and external ear normal.  Left Ear: Tympanic membrane and external ear normal.  Eyes: EOM and lids are normal. Pupils are equal, round, and reactive to light.  Neck: Normal range of motion. Neck supple. Carotid bruit is not present.  Cardiovascular: Normal rate, regular rhythm, normal heart sounds, intact distal pulses and normal pulses.   Pulmonary/Chest: Breath sounds normal. No respiratory distress.  Abdominal: Soft. Bowel sounds are normal. There is no tenderness. There is no guarding.  Musculoskeletal: Normal range of motion.       Right hand: She exhibits tenderness. She exhibits normal capillary refill and no deformity. Decreased strength noted. She exhibits thumb/finger opposition.  Pain at the base of the right thumb. Pain with flexion and extending the right thumb.  Lymphadenopathy:       Head (right side): No submandibular adenopathy present.       Head (left side): No submandibular adenopathy present.    She has no cervical adenopathy.  Neurological: She is alert and oriented to person, place, and time. She has normal strength. No cranial nerve deficit or sensory deficit.  Skin: Skin is warm and dry.  Psychiatric: She has a normal mood and affect. Her speech is normal.  Nursing note and vitals reviewed.   ED Course  Procedures (including critical care time) FRACTURE CARE RIGHT HAND Patient is a 59 year old female who states that a few weeks ago she stumbled over her dog, hit her hand and sustained an injury. The pain kept getting progressively worse and she presented to the emergency department. The x-ray reveals a possible nondisplaced intra-articular proximal first metacarpal fracture.  The procedure was explained to  the patient in terms which he understands. Patient identified by arm band. Patient fitted with a thumb spica splint. (Velcro) Labs Review Labs Reviewed - No data to display  Imaging Review Dg Hand Complete Right  08/05/2015  CLINICAL DATA:  Fall with right thenar hand pain EXAM: RIGHT HAND - COMPLETE 3+ VIEW COMPARISON:  None. FINDINGS: There is slight cortical irregularity at the proximal aspect of the first metacarpal with associated surrounding soft tissue swelling, suspicious for nondisplaced intra-articular fracture. No additional potential fracture is seen in the right hand. No dislocation. 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 IMPRESSION: Likely nondisplaced intra-articular proximal right first metacarpal fracture, see comments. Electronically Signed   By: Delbert Phenix M.D.   On: 08/05/2015 11:32  I have personally reviewed and evaluated these images and lab results as part of my medical decision-making.   EKG Interpretation None      MDM  Vital signs are well within normal limits. The x-ray reveals a possible nondisplaced intra-articular fracture of the first metacarpal. The patient is fitted with a thumb spica splint and referred to hand specialist.    Final diagnoses:  None    **I have reviewed nursing notes, vital signs, and all appropriate lab and imaging results for this patient.    Ivery QualeHobson Lavetta Geier, PA-C 08/05/15 1251  Ivery QualeHobson Liliyana Thobe, PA-C 08/05/15 2144

## 2015-08-05 NOTE — ED Notes (Signed)
Thumb spica applied. CSM intact after applying device.

## 2015-08-05 NOTE — Discharge Instructions (Signed)
Your x-ray suggested a possible fracture of the joint space of your right thumb. It is extremely important that she see the hand specialist concerning this, as your thumb is so vitally important to the use of your hand. Please keep your hand elevated as much as possible. Please use ibuprofen 3 times daily for inflammation. May use Norco every 4 hours if needed for pain. Norco may cause drowsiness, please use this medication with caution. Thumb Fracture A thumb fracture is a break in one of the two bones of your thumb. The thumb bone that goes from the tip of your thumb to the first joint in your thumb is called the distal phalanx. The thumb bone that goes from the first joint to the joint at the base of your thumb is called the proximal phalanx. Breaks that occur at the joints of your thumb are harder to treat. A broken thumb is more serious than a break in one of your other fingers because you need your thumb for grasping. Thumb fractures are also more likely to lead to pain and stiffness years after healing (arthritis). CAUSES Thumb fractures may be caused by:  A direct blow to your thumb.  Stress on your thumb from it being pulled out of place. These types of injuries often happen as a result of:  Car accidents.  Bicycle accidents.  Falling with your hand outstretched.  Participating in sports such as wrestling, hockey, football, or skiing. RISK FACTORS You may be more likely to break your thumb if you have a condition that causes your bones to become thin and brittle (osteoporosis). SIGNS AND SYMPTOMS The most common symptom is severe pain at the fracture site. Other signs and symptoms may include:  Swelling.  Bruising.  Not being able to move the thumb.  An abnormal shape of the thumb (deformity).  Numbness or coldness.  A red, black, or blue thumbnail. DIAGNOSIS Your health care provider may suspect a thumb fracture if you recently injured your thumb and have signs and  symptoms of a fracture. An X-ray of your thumb may be done to confirm the diagnosis and determine how bad the break is. TREATMENT A thumb fracture should be treated as soon as possible. You may need to wear a padded splint to keep your thumb from moving and to protect your thumb until you can get a cast or have surgery. Treatment options include:  Immobilization.  A cast or splint is put on the injured area without changing the position of the broken bone.  You may have to wear a type of cast called a spica cast or hitchhiker cast to hold the thumb in the proper position.  A cast is usually left on for 4-6 weeks.  Closed reduction.  In this procedure, the bones are put back into position without surgery.  Open reduction and internal fixation (ORIF). This is a surgical procedure.  First, the fracture site is opened up.  Then, the bone pieces are fixed into place with metal screws, plates, or wires.  External fixation.  In this type of open reduction, the fracture is held in place by metal pins.  The pins are attached to a stabilizing bar outside your skin.  You may need to wear a cast after surgery for up to 6 weeks.  You may need to return for X-rays to make sure your thumb is healing properly.  After your cast is taken off, you may need to do hand exercises (physical therapy) to get movement  back in your thumb.  It may take another 3 months to regain complete use of your thumb. HOME CARE INSTRUCTIONS  Take medicines only as directed by your health care provider.  Keep your hand elevated above the level of your heart when resting.  Keep your cast dry when bathing. Cover it with a plastic bag as directed by your health care provider.  After your cast is removed, exercise your thumb at home. Your health care provider may suggest that you:  Move your thumb in circles.  Touch your thumb to your little finger.  Do these exercises several times a day.  Ask your health  care provider whether you can use a hand exerciser to strengthen your muscles.  If your thumb feels stiff while you are exercising it, try doing the exercises while soaking your hand in warm water.  Keep all follow-up visits as directed by your health care provider. This is important. SEEK MEDICAL CARE IF:  You have more than a small spot of bleeding from under your cast or splint.  Your pain medicine is not helping.  You have a fever.  You have numbness or tingling in the injured area.  Your cast becomes loose or damaged.  You notice a bad odor or discharge coming from under your cast. SEEK IMMEDIATE MEDICAL CARE IF:  You have pain that is very bad or getting worse.  You lose feeling in your thumb.  Your thumb turns pale or blue.  Your thumb feels cold.  You have drainage, redness, or swelling at the injury site. MAKE SURE YOU:  Understand these instructions.  Will watch your condition.  Will get help right away if you are not doing well or get worse.   This information is not intended to replace advice given to you by your health care provider. Make sure you discuss any questions you have with your health care provider.   Document Released: 07/02/2003 Document Revised: 06/24/2015 Document Reviewed: 12/06/2013 Elsevier Interactive Patient Education [employer]2016 [employer] Inc.

## 2015-08-11 ENCOUNTER — Ambulatory Visit (HOSPITAL_COMMUNITY)
Admission: RE | Admit: 2015-08-11 | Discharge: 2015-08-11 | Disposition: A | Discharge status: Home / Self Care | Source: Ambulatory Visit | Attending: Anesthesiology | Admitting: Anesthesiology

## 2015-08-11 DIAGNOSIS — M79604 Pain in right leg: Secondary | ICD-10-CM | POA: Insufficient documentation

## 2015-08-11 DIAGNOSIS — M79605 Pain in left leg: Secondary | ICD-10-CM | POA: Insufficient documentation

## 2015-08-11 DIAGNOSIS — M545 Low back pain, unspecified: Secondary | ICD-10-CM

## 2015-08-11 DIAGNOSIS — M5136 Other intervertebral disc degeneration, lumbar region: Secondary | ICD-10-CM | POA: Insufficient documentation

## 2015-08-11 DIAGNOSIS — R200 Anesthesia of skin: Secondary | ICD-10-CM

## 2015-11-02 ENCOUNTER — Telehealth (HOSPITAL_COMMUNITY): Payer: Self-pay | Admitting: *Deleted

## 2015-11-02 NOTE — Telephone Encounter (Signed)
Pt called stating she would like to come to see Dr. Tenny Crawoss. Asked pt if she ever seeked help for alcohol detox and if office could get those notes faxed. Per pt, she did not go to the detox center. Reminded pt of 06-03-2015 conversation of what Dr. Tenny Crawoss stated about her going to alcohol detox first. Per pt she do not see any need to go to alcohol detox. Per pt, she do not drink any more, she's controlling that herself. Informed informed pt of the conversation again and informed her that message will be sent to Dr. Tenny Crawoss. Per pt, she don't know why she need to be admitted to a hospital for alcohol detox and she's controlling it herself.

## 2015-11-02 NOTE — Telephone Encounter (Signed)
The same expectation still stands.. She has a history of drinking and using controlled substances at the same time and I expect her to get help with the drinking

## 2016-02-22 IMAGING — DX DG HAND COMPLETE 3+V*R*
3 series · 3 of 3 positions shown · non-contrast
Comparison: None.

CLINICAL DATA: Fall with right thenar hand pain

EXAM:
RIGHT HAND - COMPLETE 3+ VIEW

[hand pa]
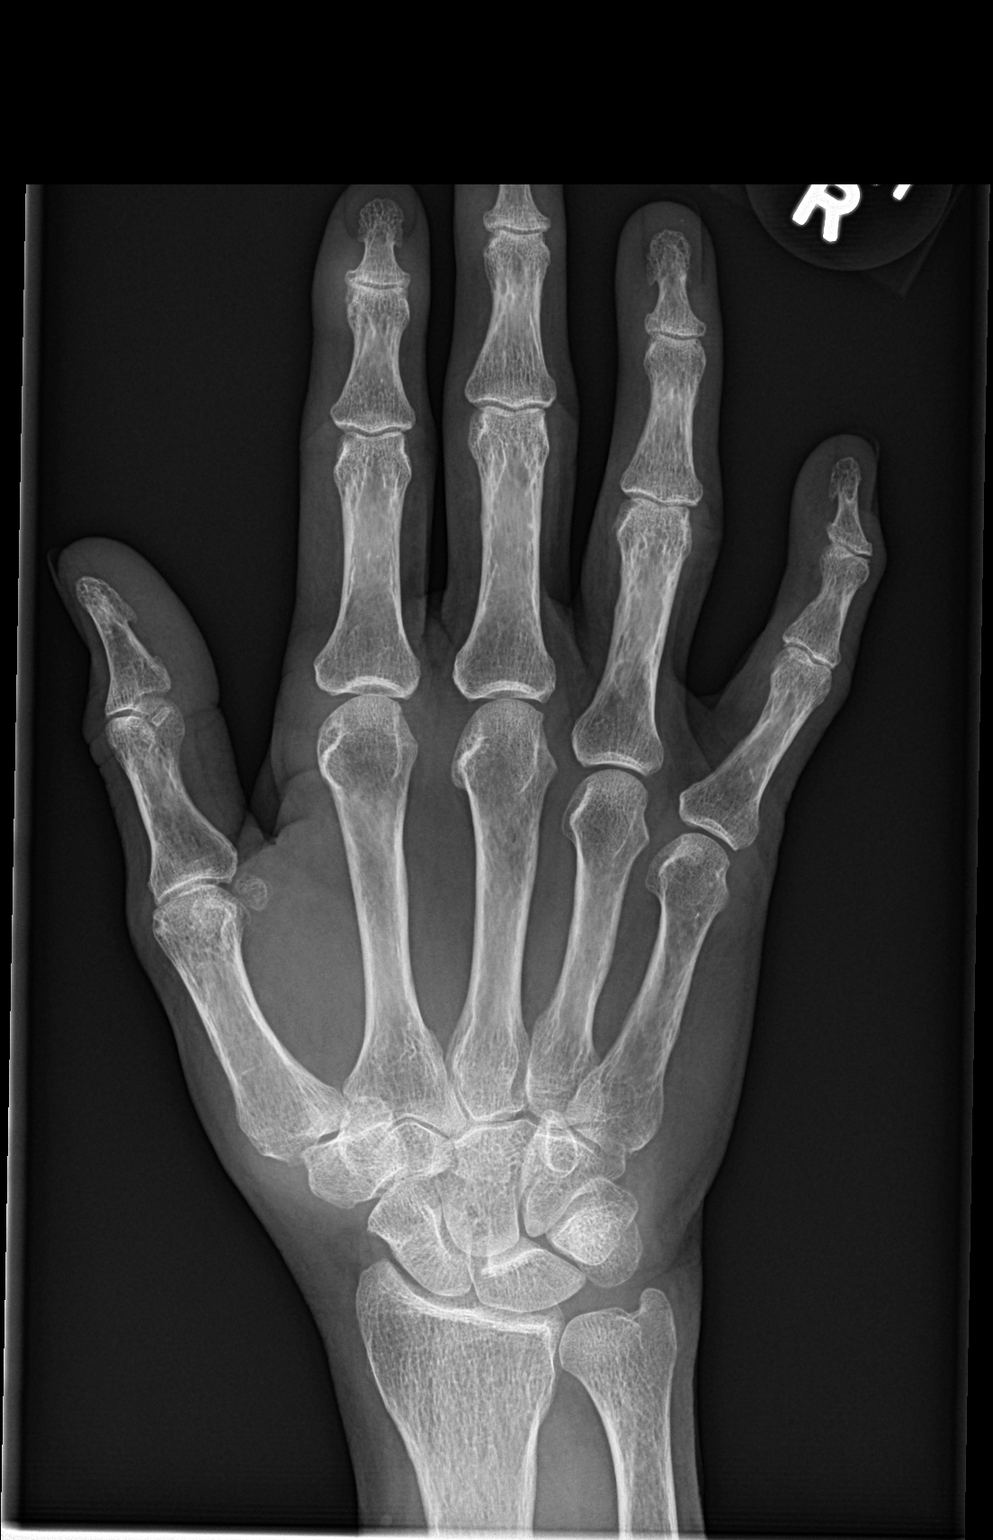

[hand obl]
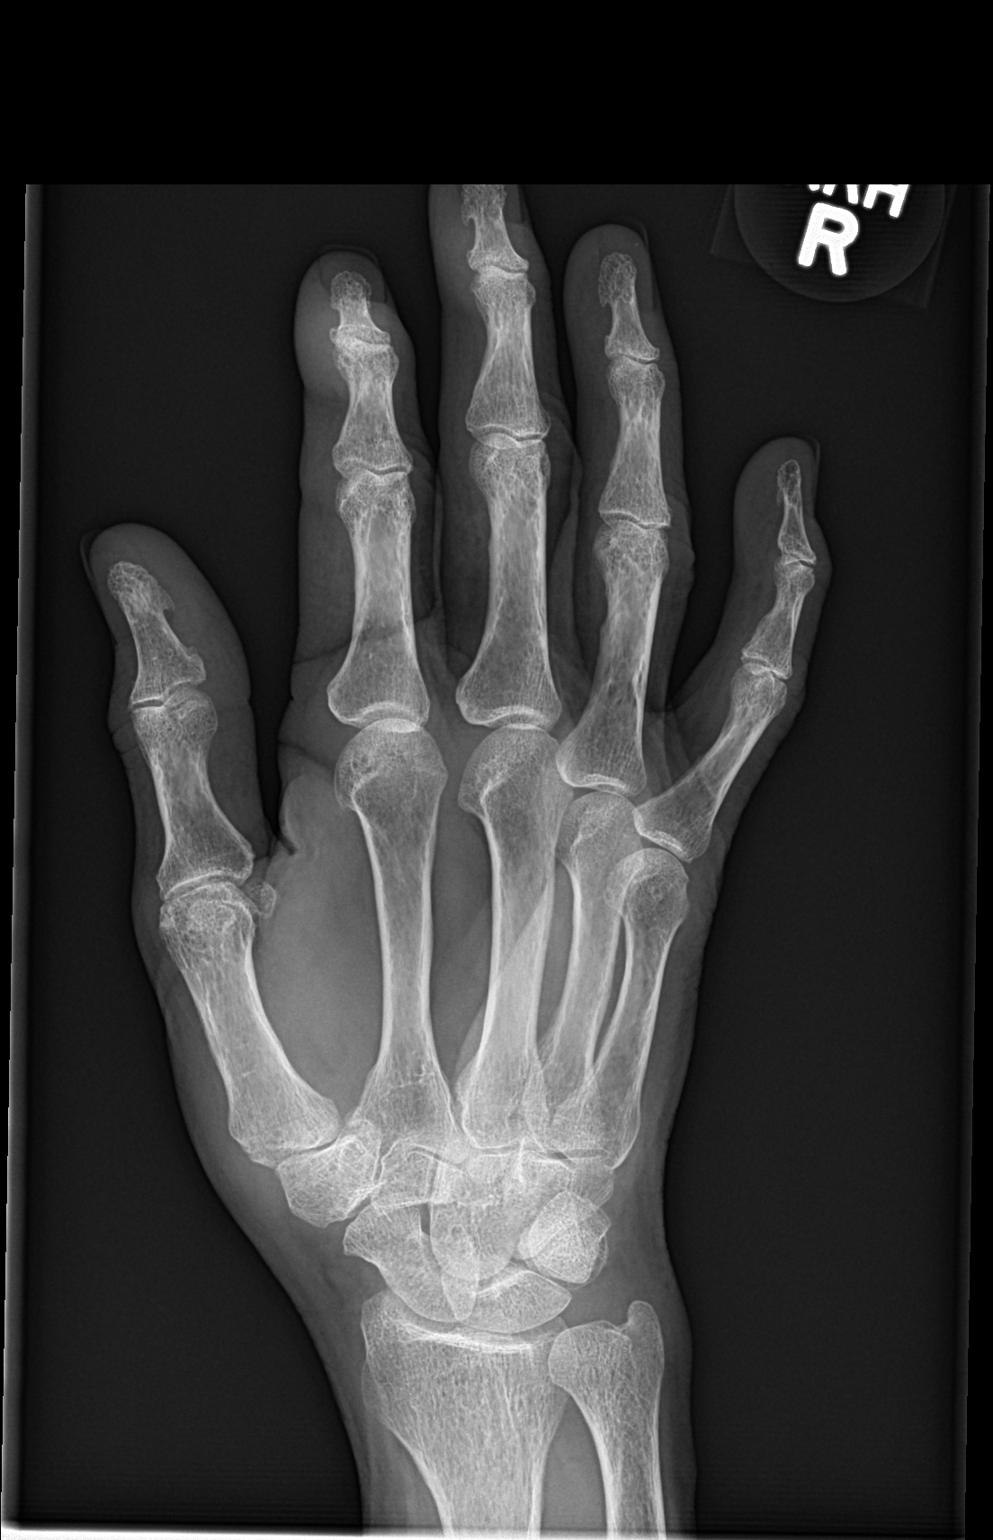

[hand lat]
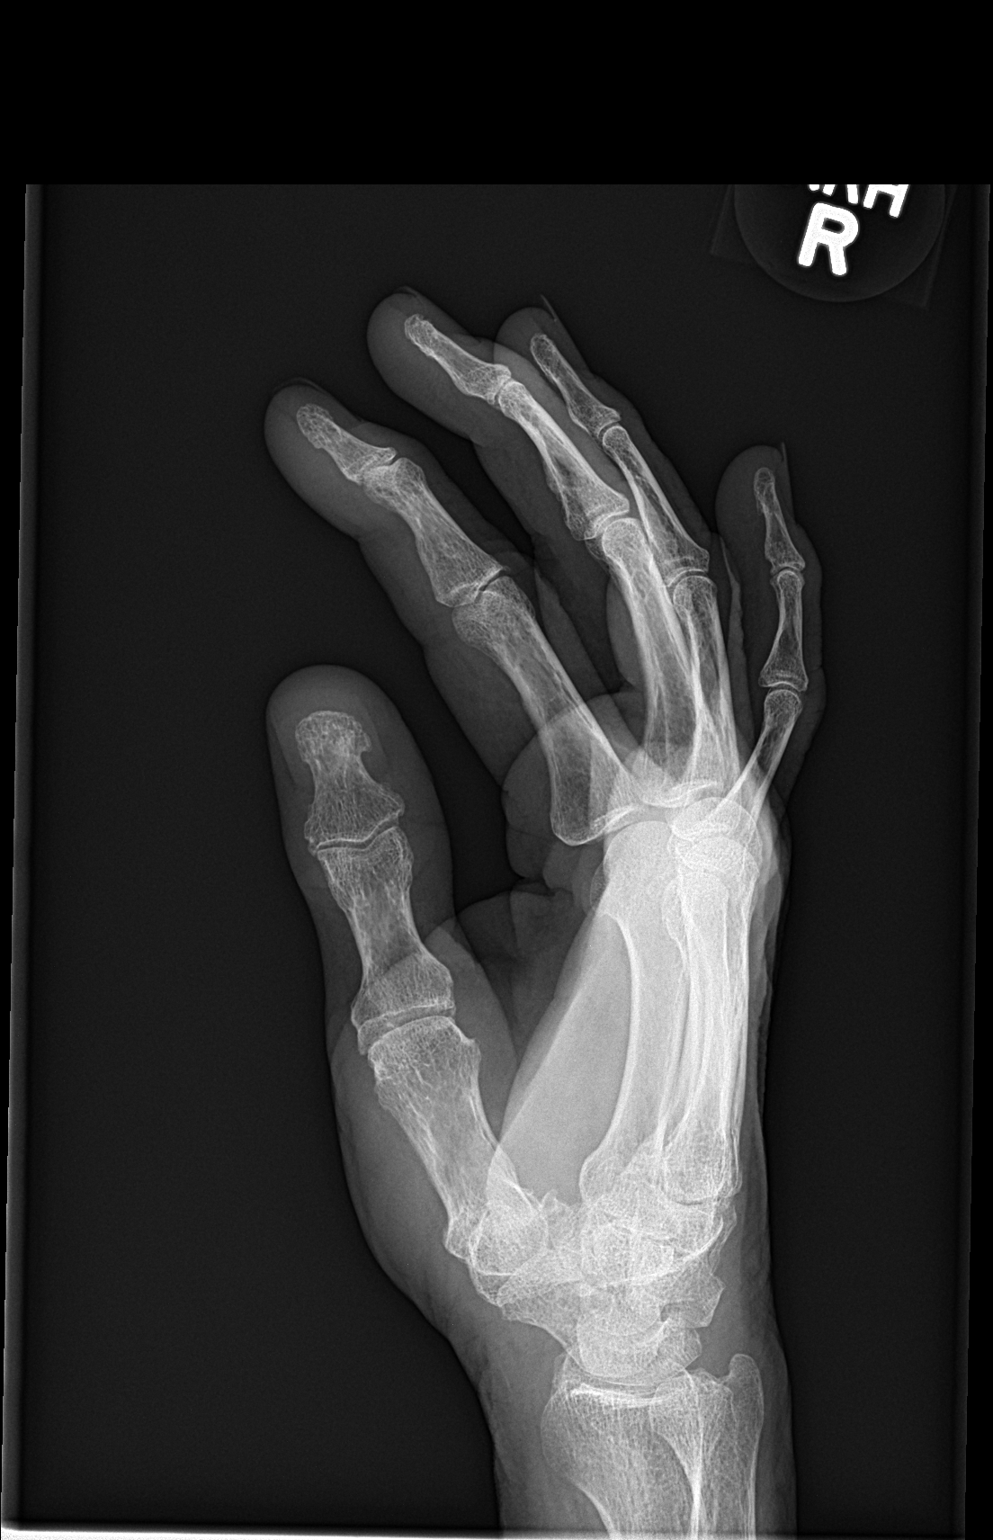

[3 of 3 positions shown; findings below may reference images not displayed]

FINDINGS: There is slight cortical irregularity at the proximal aspect of the
first metacarpal with associated surrounding soft tissue swelling,
suspicious for nondisplaced intra-articular fracture. No additional
potential fracture is seen in the right hand. No dislocation. Mild
osteoarthritis at the interphalangeal joint of the right thumb.
IMPRESSION: Likely nondisplaced intra-articular proximal right first metacarpal
fracture, see comments.

## 2016-05-18 ENCOUNTER — Encounter (HOSPITAL_COMMUNITY): Payer: Self-pay | Admitting: Emergency Medicine

## 2016-05-18 ENCOUNTER — Emergency Department (HOSPITAL_COMMUNITY)
Admission: EM | Admit: 2016-05-18 | Discharge: 2016-05-18 | Disposition: A | Discharge status: Home / Self Care | Attending: Emergency Medicine | Admitting: Emergency Medicine

## 2016-05-18 DIAGNOSIS — I10 Essential (primary) hypertension: Secondary | ICD-10-CM | POA: Insufficient documentation

## 2016-05-18 DIAGNOSIS — E119 Type 2 diabetes mellitus without complications: Secondary | ICD-10-CM | POA: Insufficient documentation

## 2016-05-18 DIAGNOSIS — F17210 Nicotine dependence, cigarettes, uncomplicated: Secondary | ICD-10-CM | POA: Diagnosis not present

## 2016-05-18 DIAGNOSIS — Z008 Encounter for other general examination: Secondary | ICD-10-CM

## 2016-05-18 DIAGNOSIS — Z046 Encounter for general psychiatric examination, requested by authority: Secondary | ICD-10-CM | POA: Insufficient documentation

## 2016-05-18 DIAGNOSIS — Z79899 Other long term (current) drug therapy: Secondary | ICD-10-CM | POA: Diagnosis not present

## 2016-05-18 DIAGNOSIS — I2510 Atherosclerotic heart disease of native coronary artery without angina pectoris: Secondary | ICD-10-CM | POA: Insufficient documentation

## 2016-05-18 DIAGNOSIS — J449 Chronic obstructive pulmonary disease, unspecified: Secondary | ICD-10-CM | POA: Diagnosis not present

## 2016-05-18 LAB — COMPREHENSIVE METABOLIC PANEL
ALBUMIN: 4.8 g/dL (ref 3.5–5.0)
ALT: 33 U/L (ref 14–54)
ANION GAP: 7 (ref 5–15)
AST: 29 U/L (ref 15–41)
Alkaline Phosphatase: 110 U/L (ref 38–126)
BUN: 6 mg/dL (ref 6–20)
CALCIUM: 9.4 mg/dL (ref 8.9–10.3)
CHLORIDE: 103 mmol/L (ref 101–111)
CO2: 28 mmol/L (ref 22–32)
Creatinine, Ser: 0.67 mg/dL (ref 0.44–1.00)
GFR calc non Af Amer: >60 mL/min (ref >60)
GLUCOSE: 97 mg/dL (ref 65–99)
POTASSIUM: 4.2 mmol/L (ref 3.5–5.1)
SODIUM: 138 mmol/L (ref 135–145)
Total Bilirubin: 0.4 mg/dL (ref 0.3–1.2)
Total Protein: 7.7 g/dL (ref 6.5–8.1)

## 2016-05-18 LAB — RAPID URINE DRUG SCREEN, HOSP PERFORMED
AMPHETAMINES: NOT DETECTED
BENZODIAZEPINES: NOT DETECTED
Barbiturates: NOT DETECTED
Cocaine: NOT DETECTED
Opiates: NOT DETECTED
TETRAHYDROCANNABINOL: NOT DETECTED

## 2016-05-18 LAB — CBC
HEMATOCRIT: 46.6 % — AB (ref 36.0–46.0)
HEMOGLOBIN: 15.6 g/dL — AB (ref 12.0–15.0)
MCH: 31.3 pg (ref 26.0–34.0)
MCHC: 33.5 g/dL (ref 30.0–36.0)
MCV: 93.4 fL (ref 78.0–100.0)
PLATELETS: 293 10*3/uL (ref 150–400)
RBC: 4.99 MIL/uL (ref 3.87–5.11)
RDW: 13.3 % (ref 11.5–15.5)
WBC: 10.1 10*3/uL (ref 4.0–10.5)

## 2016-05-18 LAB — ETHANOL: Alcohol, Ethyl (B): <5 mg/dL (ref <5)

## 2016-05-18 NOTE — Clinical Social Work Note (Signed)
Pt consulted due to outpatient resources. Pt's granddaughter took out IVC paperwork, concerned about her drinking. Pt indicates that she drank a couple bottles of wine last night and admitted that she overdid it, but states she does not drink on a regular basis. She describes some stressors right now. Pt does not feel she needs any substance use referrals. She would be interested in seeing a psychiatrist for follow up, but has [employer]. Pt reports that these are her husband's benefits and she cannot go to the [place] clinic. CSW provided extensive outpatient psych referral list and pt plans to call to make appointment. She refuses to be seen at [facility]. CSW also provided information on [facility], resources for her grandson. Per ED RN, pt's IVC will be rescinded.   Alexis Fennel, LCSW [phone]

## 2016-05-18 NOTE — ED Triage Notes (Signed)
Pt reports granddaughter took out IVC paperwork due to pt's "drinking habits". Pt denies SI/HI/delusion/voices. Pt calm and cooperative. [employer] officer at bedside. Pt reports occasional wine use and reports last drink was a glass of wine last night. Pt reports " I got into a disagreement last night with her and that's why she took out papers."

## 2016-05-18 NOTE — ED Provider Notes (Signed)
[facility]-EM[facility]Y DEPT Provider Note   CSN: [id number] Arrival date & time: 05/18/16  1006  First Provider Contact:  First MD Initiated Contact with Patient 05/18/16 1023     By signing my name below, I, Majel Homer, attest that this documentation has been prepared under the direction and in the presence of Nira Conn, MD . Electronically Signed: Majel Homer, Scribe. 05/18/2016. 10:43 AM.  History   Chief Complaint Chief Complaint  Patient presents with  . V70.1   The history is provided by the patient. No language interpreter was used.   HPI Comments: Alexis Munoz is a 60 y.o. female with PMHx of Type II DM, MI, COPD and bipolar disorder, who presents to the Emergency Department because of IVC by granddaughter due to her "drinking habits.". She reports she does not drink every day, "only every now and then." She notes she drank 2 bottles of wine last night because she was "stressed out." Pt reports she got into a disagreement with her granddaughter last night and believes this is why she "took out papers." She denies hx of DUI and shaking or seizures after drinking. Pt notes a hx of bipolar disorder, anxiety and depression due to her daughter's condition. She reports her daughter has major depression and was recently admitted to [facility] in which she experienced a seizure on the way home from being discharged. She states her support system is her sister whom she lives with and takes care of; she denies feeling overwhelmed and states she feels safe in her home. She states she is currently feeling anxious now in the ED. Pt denies SI, attempts to injure herself, HI, and auditory or visual hallucinations. She also denies appetite change, chest pain, shortness of breath, nausea, vomiting, abdominal pain, headache, rhinorrhea and congestion. She notes she takes Cymbalta and vistaril. She notes she does not currently see anyone for her bipolar disorder and does not have a Child psychotherapist. Pt states  she smokes ~0.5 packs a day.   Past Medical History:  Diagnosis Date  . Anxiety   . Arthritis   . COPD (chronic obstructive pulmonary disease) (HCC)    ongoing tobacco abuse  . Coronary artery disease   . Depression   . Diabetes mellitus without complication (HCC)   . Diabetes mellitus, type II (HCC)   . Mood disorder (HCC)   . Restless leg   . Sleep apnea   . Thyroid disease     Patient Active Problem List   Diagnosis Date Noted  . Essential hypertension 05/22/2015  . Hyperlipidemia 05/22/2015  . Major depression (HCC) 04/03/2015    Past Surgical History:  Procedure Laterality Date  . BREAST SURGERY    . CARDIAC CATHETERIZATION    . CARPAL TUNNEL RELEASE    . NECK SURGERY    . TUBAL LIGATION      OB History    Gravida Para Term Preterm AB Living   2 2 2          SAB TAB Ectopic Multiple Live Births                 Home Medications    Prior to Admission medications   Medication Sig Start Date End Date Taking? Authorizing Provider  budesonide-formoterol (SYMBICORT) 160-4.5 MCG/ACT inhaler Inhale 2 puffs into the lungs 2 (two) times daily. 08/27/14  Yes Burgess Amor, PA-C  DULoxetine (CYMBALTA) 60 MG capsule Take 1 capsule (60 mg total) by mouth 2 (two) times daily. 04/03/15  Yes Gavin Pound  Roseanna Rainbow, MD  ipratropium-albuterol (DUONEB) 0.5-2.5 (3) MG/3ML SOLN Take 3 mLs by nebulization 4 (four) times daily.   Yes Historical Provider, MD  lamoTRIgine (LAMICTAL) 200 MG tablet Take 1 tablet (200 mg total) by mouth at bedtime. Patient taking differently: Take 100 mg by mouth at bedtime.  04/03/15  Yes Myrlene Broker, MD  levothyroxine (SYNTHROID, LEVOTHROID) 50 MCG tablet Take 50 mcg by mouth daily before breakfast.   Yes Historical Provider, MD  metoprolol succinate (TOPROL-XL) 25 MG 24 hr tablet Take 12.5 mg by mouth at bedtime.   Yes Historical Provider, MD  midodrine (PROAMATINE) 5 MG tablet Take 5 mg by mouth 2 (two) times daily with a meal.   Yes Historical Provider, MD    pantoprazole (PROTONIX) 40 MG tablet Take 40 mg by mouth daily.   Yes Historical Provider, MD  pravastatin (PRAVACHOL) 40 MG tablet Take 40 mg by mouth at bedtime.   Yes Historical Provider, MD  rOPINIRole (REQUIP) 1 MG tablet Take 1 mg by mouth at bedtime.   Yes Historical Provider, MD  vitamin B-12 (CYANOCOBALAMIN) 500 MCG tablet Take 500 mcg by mouth daily.   Yes Historical Provider, MD    Family History Family History  Problem Relation Age of Onset  . Cancer Mother   . Alcohol abuse Mother   . Diabetes Father   . Cancer Father   . Depression Father   . Alcohol abuse Father   . Diabetes Sister   . Heart failure Sister   . Schizophrenia Sister   . Depression Sister   . Cancer Brother   . Schizophrenia Other     Social History Social History  Substance Use Topics  . Smoking status: Current Every Day Smoker    Packs/day: 0.50    Types: Cigarettes  . Smokeless tobacco: Never Used  . Alcohol use Yes     Comment: occ     Allergies   Clarithromycin; Sulfa antibiotics; and Amoxicillin   Review of Systems Review of Systems  Constitutional: Negative for appetite change.  HENT: Negative for congestion and rhinorrhea.   Respiratory: Negative for shortness of breath.   Cardiovascular: Negative for chest pain.  Gastrointestinal: Negative for abdominal pain, nausea and vomiting.  Neurological: Negative for headaches.  Psychiatric/Behavioral: Negative for hallucinations, self-injury and suicidal ideas.   Physical Exam Updated Vital Signs BP 126/81 (BP Location: Right Arm)   Pulse 88   Resp 18   Ht  (1.575 m)   Wt 170 lb (77.1 kg)   SpO2 98%   BMI 31.09 kg/m   Physical Exam  Constitutional: She is oriented to person, place, and time. She appears well-developed and well-nourished.  HENT:  Head: Normocephalic and atraumatic.  Eyes: Conjunctivae are normal. Right eye exhibits no discharge. Left eye exhibits no discharge.  Neck: Normal range of motion. Neck  supple. No tracheal deviation present.  Cardiovascular: Normal rate and regular rhythm.   Pulmonary/Chest: Effort normal and breath sounds normal.  Abdominal: Soft. She exhibits no distension. There is no tenderness. There is no guarding.  Musculoskeletal: She exhibits no edema.  Neurological: She is alert and oriented to person, place, and time.  Skin: Skin is warm. No rash noted.  Psychiatric: She has a normal mood and affect. Her speech is normal and behavior is normal. Judgment and thought content normal. Cognition and memory are normal.  Pt was tearful when discussing her daughter's recent major depression episode; but this is appropriate. Otherwise, pt's mood and affect was  wnl.  Nursing note and vitals reviewed.  ED Treatments / Results  Labs (all labs ordered are listed, but only abnormal results are displayed) Labs Reviewed  CBC - Abnormal; Notable for the following:       Result Value   Hemoglobin 15.6 (*)    HCT 46.6 (*)    All other components within normal limits  COMPREHENSIVE METABOLIC PANEL  ETHANOL  URINE RAPID DRUG SCREEN, HOSP PERFORMED    EKG  EKG Interpretation None       Radiology No results found.  Procedures Procedures  DIAGNOSTIC STUDIES:  COORDINATION OF CARE:  10:36 AM Discussed treatment plan with pt at bedside and pt agreed to plan.   Medications Ordered in ED Medications - No data to display   Initial Impression / Assessment and Plan / ED Course  I have reviewed the triage vital signs and the nursing notes.  Pertinent labs & imaging results that were available during my care of the patient were reviewed by me and considered in my medical decision making (see chart for details).  Clinical Course    Patient sent here for psychiatric evaluation via IVC paperwork place by the granddaughter due to alcohol consumption and concern for potential self-harm. On arrival patient is oriented 3, cooperative, calm, pleasant. She denies any SI,  HI, AVH. She does endorse trouble sleeping over this is due to stress at home taking care of her sister. Otherwise patient finds pleasure doing things that she likes has a good appetite and has good forward thinking. Speech is not pressured and there is no evidence of mania.  Attempt to contact the granddaughter for questioning was unsuccessful.  Screening labs obtained unremarkable.  I feel the patient does not need inpatient treatment or commitment. She is not a threat to others or herself. She is appropriate for discharge.   Patient does report that she does not have good psychiatric follow-up. We contacted social worker who provided the patient with additional resources which the patient will contact for continued management of her character disorder.   I personally performed the services described in this documentation, which was scribed in my presence. The recorded information has been reviewed and is accurate.   Final Clinical Impressions(s) / ED Diagnoses   Final diagnoses:  Encounter for psychological evaluation   Disposition: Discharge  Condition: Good  I have discussed the results, Dx and Tx plan with the patient who expressed understanding and agree(s) with the plan. Discharge instructions discussed at great length. The patient was given strict return precautions who verbalized understanding of the instructions. No further questions at time of discharge.    Discharge Medication List as of 05/18/2016  3:20 PM      Follow Up: Behavioral health   for continued management of your Bipolar disease and counseling as needed.      Nira Conn, MD 05/18/16 [phone]

## 2016-05-18 NOTE — ED Notes (Signed)
Pt changed into her clothes and ambulated without difficulty to the lobby to wait on the Officer.

## 2016-08-09 ENCOUNTER — Other Ambulatory Visit (HOSPITAL_COMMUNITY): Payer: Self-pay | Admitting: Physician Assistant

## 2016-08-09 DIAGNOSIS — Z1231 Encounter for screening mammogram for malignant neoplasm of breast: Secondary | ICD-10-CM

## 2016-08-18 ENCOUNTER — Ambulatory Visit (HOSPITAL_COMMUNITY): Payer: Self-pay

## 2016-08-24 ENCOUNTER — Ambulatory Visit (HOSPITAL_COMMUNITY): Payer: Self-pay

## 2016-08-25 ENCOUNTER — Ambulatory Visit (HOSPITAL_COMMUNITY): Payer: Self-pay

## 2016-08-25 ENCOUNTER — Ambulatory Visit: Payer: Self-pay | Admitting: Cardiovascular Disease

## 2016-09-12 ENCOUNTER — Ambulatory Visit (HOSPITAL_COMMUNITY): Payer: Self-pay

## 2016-09-22 ENCOUNTER — Ambulatory Visit (HOSPITAL_COMMUNITY): Payer: Self-pay

## 2016-09-27 ENCOUNTER — Encounter: Payer: Self-pay | Admitting: Cardiology

## 2016-10-11 ENCOUNTER — Ambulatory Visit: Payer: Self-pay | Admitting: Cardiology

## 2016-10-11 ENCOUNTER — Ambulatory Visit (HOSPITAL_COMMUNITY): Payer: Self-pay

## 2016-10-12 ENCOUNTER — Ambulatory Visit (HOSPITAL_COMMUNITY): Payer: Self-pay

## 2016-11-10 ENCOUNTER — Ambulatory Visit: Payer: Self-pay | Admitting: Cardiology

## 2016-12-07 ENCOUNTER — Encounter: Payer: Self-pay | Admitting: Cardiology

## 2016-12-07 ENCOUNTER — Ambulatory Visit (INDEPENDENT_AMBULATORY_CARE_PROVIDER_SITE_OTHER): Admitting: Cardiology

## 2016-12-07 ENCOUNTER — Ambulatory Visit (HOSPITAL_COMMUNITY)
Admission: RE | Admit: 2016-12-07 | Discharge: 2016-12-07 | Disposition: A | Discharge status: Home / Self Care | Source: Ambulatory Visit | Attending: Physician Assistant | Admitting: Physician Assistant

## 2016-12-07 VITALS — BP 110/74 | HR 72 | Ht 62.0 in | Wt 188.0 lb

## 2016-12-07 DIAGNOSIS — I10 Essential (primary) hypertension: Secondary | ICD-10-CM | POA: Diagnosis not present

## 2016-12-07 DIAGNOSIS — Z1231 Encounter for screening mammogram for malignant neoplasm of breast: Secondary | ICD-10-CM | POA: Insufficient documentation

## 2016-12-07 NOTE — Progress Notes (Signed)
Clinical Summary Alexis Munoz is a 61 y.o.female last seen by Dr Alexis BattyJonathan Munoz, this is our first visit together.   1. HTN - does not check regularly at home - compliant with meds  2. Hyperlipidemia - on pravastatin 40mg  daily - reports recent labs with pcp   3. Chest pain - [facility]. At that time had an abnormal stress test in setting of chest pain. - follow up cath 2009 without significant CAD - can have burning feeling mid chest, choking like feeling. Comes on at any time. Can feel lightheaded. No SOB, no palpitations. Often brought on by stress. Lasts 5 mintues. Occurs about 2-3 times a week. Symptoms started in December around time of sisters health troubles. Pain improving since sister's health improving and her overall anxiety is decreasing. Similar to pain she had in 2009 when she had her cath.   - occasional SOB/DOE which is stable.   4. Leg pains - clinic notes mention prior normal ABIs - Right 1.09 Left 1.03 from ABI Dec 2016 - she relates pains with restless legs  5. HOCM screening - her sister Alexis NewcomerBetty Munoz is a patient of mine who I have been treating for HOCM - she does not believe she has had a screening US previously.   6. Ulcers - followed by GI - has not been on ASA   SH: her sister is Alexis NewcomerBetty Munoz, a patient of mine as well. Alexis Munoz just got out of the hospital after admission with severe pneumonia, required intubation and tracheostomy.  Past Medical History:  Diagnosis Date  . Anxiety   . Arthritis   . COPD (chronic obstructive pulmonary disease) (HCC)    ongoing tobacco abuse  . Coronary artery disease   . Depression   . Diabetes mellitus without complication (HCC)   . Diabetes mellitus, type II (HCC)   . Mood disorder (HCC)   . Restless leg   . Sleep apnea   . Thyroid disease      Allergies  Allergen Reactions  . Clarithromycin Nausea And Vomiting  . Sulfa Antibiotics Nausea Only  . Amoxicillin  Rash     Current Outpatient Prescriptions  Medication Sig Dispense Refill  . budesonide-formoterol (SYMBICORT) 160-4.5 MCG/ACT inhaler Inhale 2 puffs into the lungs 2 (two) times daily. 1 Inhaler 12  . DULoxetine (CYMBALTA) 60 MG capsule Take 1 capsule (60 mg total) by mouth 2 (two) times daily. 180 capsule 2  . ipratropium-albuterol (DUONEB) 0.5-2.5 (3) MG/3ML SOLN Take 3 mLs by nebulization 4 (four) times daily.    Marland Kitchen. lamoTRIgine (LAMICTAL) 200 MG tablet Take 1 tablet (200 mg total) by mouth at bedtime. (Patient taking differently: Take 100 mg by mouth at bedtime. ) 30 tablet 2  . levothyroxine (SYNTHROID, LEVOTHROID) 50 MCG tablet Take 50 mcg by mouth daily before breakfast.    . metoprolol succinate (TOPROL-XL) 25 MG 24 hr tablet Take 12.5 mg by mouth at bedtime.    . midodrine (PROAMATINE) 5 MG tablet Take 5 mg by mouth 2 (two) times daily with a meal.    . pantoprazole (PROTONIX) 40 MG tablet Take 40 mg by mouth daily.    . pravastatin (PRAVACHOL) 40 MG tablet Take 40 mg by mouth at bedtime.    Marland Kitchen. rOPINIRole (REQUIP) 1 MG tablet Take 1 mg by mouth at bedtime.    . vitamin B-12 (CYANOCOBALAMIN) 500 MCG tablet Take 500 mcg by mouth daily.     No current facility-administered  medications for this visit.      Past Surgical History:  Procedure Laterality Date  . BREAST SURGERY    . CARDIAC CATHETERIZATION    . CARPAL TUNNEL RELEASE    . NECK SURGERY    . TUBAL LIGATION       Allergies  Allergen Reactions  . Clarithromycin Nausea And Vomiting  . Sulfa Antibiotics Nausea Only  . Amoxicillin Rash      Family History  Problem Relation Age of Onset  . Cancer Mother   . Alcohol abuse Mother   . Diabetes Father   . Cancer Father   . Depression Father   . Alcohol abuse Father   . Diabetes Sister   . Heart failure Sister   . Schizophrenia Sister   . Depression Sister   . Cancer Brother   . Schizophrenia Other      Social History Alexis Munoz reports that she has been  smoking Cigarettes.  She has been smoking about 0.50 packs per day. She has never used smokeless tobacco. Alexis Munoz reports that she drinks alcohol.   Review of Systems CONSTITUTIONAL: No weight loss, fever, chills, weakness or fatigue.  HEENT: Eyes: No visual loss, blurred vision, double vision or yellow sclerae.No hearing loss, sneezing, congestion, runny nose or sore throat.  SKIN: No rash or itching.  CARDIOVASCULAR: per HPI RESPIRATORY: No shortness of breath, cough or sputum.  GASTROINTESTINAL: No anorexia, nausea, vomiting or diarrhea. No abdominal pain or blood.  GENITOURINARY: No burning on urination, no polyuria NEUROLOGICAL: No headache, dizziness, syncope, paralysis, ataxia, numbness or tingling in the extremities. No change in bowel or bladder control.  MUSCULOSKELETAL: No muscle, back pain, joint pain or stiffness.  LYMPHATICS: No enlarged nodes. No history of splenectomy.  PSYCHIATRIC: No history of depression or anxiety.  ENDOCRINOLOGIC: No reports of sweating, cold or heat intolerance. No polyuria or polydipsia.  Marland Kitchen   Physical Examination Vitals:   12/07/16 0926 12/07/16 0936  BP: 112/70 110/74  Pulse: 72    Vitals:   12/07/16 0926  Weight: 188 lb (85.3 kg)  Height: 5\' 2"  (1.575 m)    Gen: resting comfortably, no acute distress HEENT: no scleral icterus, pupils equal round and reactive, no palptable cervical adenopathy,  CV: RRR, no m/r/g no jvd Resp: Clear to auscultation bilaterally GI: abdomen is soft, non-tender, non-distended, normal bowel sounds, no hepatosplenomegaly MSK: extremities are warm, no edema.  Skin: warm, no rash Neuro:  no focal deficits Psych: appropriate affect   Diagnostic Studies 07/2008 cath [facility] PROCEDURE:  Left heart catheterization. Selective coronary angiography. Left ventriculography. Right femoral angiography. All performed by right femoral approach using a modified Seldinger technique.  PROCEDURE NOTE: The  patient was brought to the cath lab, prepped and draped in the usual fashion. Lidocaine 1% was infiltrated into the skin over the right femoral artery approximately 1 cm above the right inguinal ligament. Using modified Seldinger technique, a 5 French sheath was inserted. Selective left and right coronary angiography was performed using a 5 French Judkins left catheter. Left ventriculography was performed using a 5 French pigtail catheter. Right femoral angiography was performed using a 5 French femoral sheath. The patient tolerated the procedure very well. Prior to leaving the cath lab, the sheath was pulled out and homeostasis was obtained by Angio-Seal device.  TECHNICAL DATA: Total amount of contrast material used was 89 cc with a fluoro time of 1.2 minutes.  FLUOROSCOPY: Fluoroscopy revealed proximal coronary calcification.  HEMODYNAMIC DATA: LV (pre-angio) 118 with  an end-diastolic pressure of 12 mmHg. LV (post-angio) 112 with an end-diastolic pressure of 19 mmHg. Central aortic pressure was 104/61 with a mean of 82 mmHg. There was no significant gradient recorded across the aortic valve.  LEFT VENTRICULOGRAPHY: Left ventriculography revealed a normal-sized left ventricular cavity, with no hypokinesia, with ejection fraction which appears to be in the range of 65%. There is a mild mitral regurgitation seen.  RIGHT FEMORAL ANGIOGRAPHY: Right femoral angiography revealed the point of sheath insertion just above the bifurcation. It was suitable enough for Angio-Seal deployment and it was deployed successfully.  CORONARY ANATOMY:   A. LEFT MAIN CORONARY ARTERY: The left main appears to be patent.  B. LEFT ANTERIOR DESCENDING ARTERY: The left anterior descending artery courses normally from the left main in the anterior inter ventricular groove and gives rise diagonal branches and normal arcades of septal perforater branches. There is No stenosis proximal distal small. Diagonal Latrina Guttman appears to  have no stenosis.  C. LEFT CIRCUMFLEX CORONARY ARTERY: The left circumflex artery is a co-dominant vessel with obtuse marginal branches. There is proximal no stenosis. Marginal Baraa Tubbs are free of significant disease.  D. RIGHT CORONARY ARTERY: The right coronary artery arises from the right cusp and courses in the posterior inter ventricular groove. It is a dominant vessel. Gives rise to posterior descending artery and posterolateral branches. There is no stenosis.  COMPLICATION: None.  FINAL IMPRESSION:  CAD as discussed above with normal ejection fraction.  RECOMMENDATIONS:  Medical Management with aggressive risk factors modification. Statin therapy, smoke cessation counseling.         Assessment and Plan  1. HTN - at goal, continue current meds  2. Hyperlipidemia - request labs from pcp - she has been on pravstatin. May consider more potent statin once we receive lipid results  3. Chest pain - long history of chest pain, primarily stress related. Negative workup in 2009 with cath. EKG in clinic today SR, no ischemic changes - continue to monitor at this time. If symptoms progress can consider repeat ischemic testing.   4. HOCM screening - first degree relateive with HOCM, we will obtain echo. Should be screened every 5 years  F/u 2 months    Antoine Poche, M.D.

## 2016-12-07 NOTE — Patient Instructions (Signed)
Medication Instructions:  Your physician recommends that you continue on your current medications as directed. Please refer to the Current Medication list given to you today.   Labwork: I WILL REQUEST A COPY OF LABS FROM PCP  Testing/Procedures: Your physician has requested that you have an echocardiogram. Echocardiography is a painless test that uses sound waves to create images of your heart. It provides your doctor with information about the size and shape of your heart and how well your heart's chambers and valves are working. This procedure takes approximately one hour. There are no restrictions for this procedure.    Follow-Up: Your physician recommends that you schedule a follow-up appointment in: 2 MONTHS    Any Other Special Instructions Will Be Listed Below (If Applicable).     If you need a refill on your cardiac medications before your next appointment, please call your pharmacy.

## 2016-12-12 ENCOUNTER — Other Ambulatory Visit (HOSPITAL_COMMUNITY): Payer: Self-pay

## 2016-12-16 ENCOUNTER — Other Ambulatory Visit (HOSPITAL_COMMUNITY): Payer: Self-pay

## 2016-12-23 ENCOUNTER — Ambulatory Visit (HOSPITAL_COMMUNITY)

## 2017-01-11 ENCOUNTER — Ambulatory Visit (HOSPITAL_COMMUNITY)

## 2017-01-18 ENCOUNTER — Ambulatory Visit (HOSPITAL_COMMUNITY)
Admission: RE | Admit: 2017-01-18 | Discharge: 2017-01-18 | Disposition: A | Discharge status: Home / Self Care | Source: Ambulatory Visit | Attending: Cardiology | Admitting: Cardiology

## 2017-01-18 DIAGNOSIS — I10 Essential (primary) hypertension: Secondary | ICD-10-CM

## 2017-01-18 DIAGNOSIS — I119 Hypertensive heart disease without heart failure: Secondary | ICD-10-CM | POA: Insufficient documentation

## 2017-01-18 DIAGNOSIS — E785 Hyperlipidemia, unspecified: Secondary | ICD-10-CM | POA: Diagnosis not present

## 2017-01-18 DIAGNOSIS — R079 Chest pain, unspecified: Secondary | ICD-10-CM | POA: Diagnosis present

## 2017-01-18 LAB — ECHOCARDIOGRAM COMPLETE
CHL CUP DOP CALC LVOT VTI: 35.5 cm
EERAT: 7.14
EWDT: 278 ms
FS: 38 % (ref 28–44)
IV/PV OW: 1.23
LA diam end sys: 29 mm
LA diam index: 1.47 cm/m2
LA vol A4C: 32.4 ml
LASIZE: 29 mm
LAVOL: 28.5 mL
LAVOLIN: 14.5 mL/m2
LDCA: 2.27 cm2
LV E/e' medial: 7.14
LV E/e'average: 7.14
LV PW d: 9.48 mm — AB (ref 0.6–1.1)
LV TDI E'LATERAL: 8.7
LV dias vol index: 22 mL/m2
LV dias vol: 44 mL — AB (ref 46–106)
LV sys vol index: 8 mL/m2
LV sys vol: 16 mL
LVELAT: 8.7 cm/s
LVOT SV: 81 mL
LVOT peak grad rest: 9 mmHg
LVOT peak vel: 154 cm/s
LVOTD: 17 mm
MV Dec: 278
MV pk A vel: 98.1 m/s
MV pk E vel: 62.1 m/s
RV LATERAL S' VELOCITY: 11.3 cm/s
Simpson's disk: 63
Stroke v: 28 ml
TAPSE: 21.1 mm
TDI e' medial: 5.87

## 2017-01-18 NOTE — Progress Notes (Signed)
*  PRELIMINARY RESULTS* Echocardiogram 2D Echocardiogram has been performed.  Stacey Drain 01/18/2017, 12:02 PM

## 2017-02-09 ENCOUNTER — Ambulatory Visit (INDEPENDENT_AMBULATORY_CARE_PROVIDER_SITE_OTHER): Admitting: Cardiology

## 2017-02-09 ENCOUNTER — Encounter: Payer: Self-pay | Admitting: *Deleted

## 2017-02-09 VITALS — BP 110/74 | HR 75 | Ht 62.0 in | Wt 189.0 lb

## 2017-02-09 DIAGNOSIS — E782 Mixed hyperlipidemia: Secondary | ICD-10-CM

## 2017-02-09 DIAGNOSIS — R002 Palpitations: Secondary | ICD-10-CM

## 2017-02-09 DIAGNOSIS — R42 Dizziness and giddiness: Secondary | ICD-10-CM | POA: Diagnosis not present

## 2017-02-09 DIAGNOSIS — I10 Essential (primary) hypertension: Secondary | ICD-10-CM | POA: Diagnosis not present

## 2017-02-09 DIAGNOSIS — R0789 Other chest pain: Secondary | ICD-10-CM

## 2017-02-09 NOTE — Progress Notes (Signed)
Clinical Summary Ms. Cardosa is a 61 y.o.female seen today for follow up of the following medical problems.   1. HTN - compliant with meds  2. Hyperlipidemia - compliant with statin   3. Chest pain - previously seen at [facility] around 2009. At that time had an abnormal stress test in setting of chest pain. - follow up cath 2009 without significant CAD - can have burning feeling mid chest, choking like feeling. Comes on at any time. Can feel lightheaded. No SOB, no palpitations. Often brought on by stress. Lasts 5 mintues. Occurs about 2-3 times a week. Symptoms started in December around time of sisters health troubles. Pain improving since sister's health improving and her overall anxiety is decreasing. Similar to pain she had in 2009 when she had her cath.   - occasional SOB/DOE which is stable.   - no significant symptoms since last visit.     4. Leg pains - clinic notes mention prior normal ABIs - Right 1.09 Left 1.03 from ABI Dec 2016 - still with leg pains at times.   5. HOCM screening - her sister Lovey Newcomer is a patient of mine who I have been treating for HOCM - her recent did not show any evidence of HOCM  6. Ulcers - followed by GI - has not been on ASA    7. Palpitations - feeling of heart racing. Occurs 1-2 times a week. Occurs at rest or with exertion. Lasts about 1 minute. Can feel lightheaded, "blood rushing to head" - has had some increase in frequency - coffee x 4-5, no tea, diet coke x 2 glasses, occasional EtoH. Water x 3-4 glasses  8. Dizziness - mainly occurs with sitting. Feels lightheaded, like she may pass out. Lasts just a few minutes. Occurs 1-2 times a week. Symptoms started a few months ago.   9. Meningioma - surgery scheduled for end of May.     SH: her sister is Lovey Newcomer, a patient of mine as well. Ms Zonia Kief just got out of the hospital after admission with severe pneumonia, required intubation and  tracheostomy.    Past Medical History:  Diagnosis Date  . Anxiety   . Arthritis   . COPD (chronic obstructive pulmonary disease) (HCC)    ongoing tobacco abuse  . Coronary artery disease   . Depression   . Diabetes mellitus without complication (HCC)   . Diabetes mellitus, type II (HCC)   . Mood disorder (HCC)   . Restless leg   . Sleep apnea   . Thyroid disease      Allergies  Allergen Reactions  . Clarithromycin Nausea And Vomiting  . Sulfa Antibiotics Nausea Only  . Amoxicillin Rash     Current Outpatient Prescriptions  Medication Sig Dispense Refill  . budesonide-formoterol (SYMBICORT) 160-4.5 MCG/ACT inhaler Inhale 2 puffs into the lungs 2 (two) times daily. 1 Inhaler 12  . cyclobenzaprine (FLEXERIL) 10 MG tablet Take 10 mg by mouth 3 (three) times daily as needed for muscle spasms.    . DULoxetine (CYMBALTA) 60 MG capsule Take 1 capsule (60 mg total) by mouth 2 (two) times daily. 180 capsule 2  . hydrOXYzine (VISTARIL) 100 MG capsule Take 25 mg by mouth.    Marland Kitchen ipratropium-albuterol (DUONEB) 0.5-2.5 (3) MG/3ML SOLN Take 3 mLs by nebulization 4 (four) times daily.    Marland Kitchen lamoTRIgine (LAMICTAL) 200 MG tablet Take 1 tablet (200 mg total) by mouth at bedtime. (Patient taking differently: Take 100 mg by mouth  at bedtime. ) 30 tablet 2  . levothyroxine (SYNTHROID, LEVOTHROID) 50 MCG tablet Take 50 mcg by mouth daily before breakfast.    . metoprolol succinate (TOPROL-XL) 25 MG 24 hr tablet Take 12.5 mg by mouth at bedtime.    Marland Kitchen oxybutynin (DITROPAN-XL) 10 MG 24 hr tablet Take 10 mg by mouth.    . pantoprazole (PROTONIX) 40 MG tablet Take 40 mg by mouth daily.    . pravastatin (PRAVACHOL) 40 MG tablet Take 40 mg by mouth at bedtime.    Marland Kitchen rOPINIRole (REQUIP) 1 MG tablet Take 1 mg by mouth at bedtime.    Marland Kitchen spironolactone (ALDACTONE) 25 MG tablet Take 12.5 mg by mouth.    . traZODone (DESYREL) 50 MG tablet Take 50 mg by mouth at bedtime.    . vitamin B-12 (CYANOCOBALAMIN) 500 MCG  tablet Take 500 mcg by mouth daily.     No current facility-administered medications for this visit.      Past Surgical History:  Procedure Laterality Date  . BREAST SURGERY    . CARDIAC CATHETERIZATION    . CARPAL TUNNEL RELEASE    . NECK SURGERY    . TUBAL LIGATION       Allergies  Allergen Reactions  . Clarithromycin Nausea And Vomiting  . Sulfa Antibiotics Nausea Only  . Amoxicillin Rash      Family History  Problem Relation Age of Onset  . Cancer Mother   . Alcohol abuse Mother   . Diabetes Father   . Cancer Father   . Depression Father   . Alcohol abuse Father   . Diabetes Sister   . Heart failure Sister   . Schizophrenia Sister   . Depression Sister   . Cancer Brother   . Schizophrenia Other      Social History Ms. Overbay reports that she has been smoking Cigarettes.  She has been smoking about 0.50 packs per day. She has never used smokeless tobacco. Ms. Bergevin reports that she drinks alcohol.   Review of Systems CONSTITUTIONAL: No weight loss, fever, chills, weakness or fatigue.  HEENT: Eyes: No visual loss, blurred vision, double vision or yellow sclerae.No hearing loss, sneezing, congestion, runny nose or sore throat.  SKIN: No rash or itching.  CARDIOVASCULAR: per hpi RESPIRATORY: No shortness of breath, cough or sputum.  GASTROINTESTINAL: No anorexia, nausea, vomiting or diarrhea. No abdominal pain or blood.  GENITOURINARY: No burning on urination, no polyuria NEUROLOGICAL: No headache, dizziness, syncope, paralysis, ataxia, numbness or tingling in the extremities. No change in bowel or bladder control.  MUSCULOSKELETAL: No muscle, back pain, joint pain or stiffness.  LYMPHATICS: No enlarged nodes. No history of splenectomy.  PSYCHIATRIC: No history of depression or anxiety.  ENDOCRINOLOGIC: No reports of sweating, cold or heat intolerance. No polyuria or polydipsia.  Marland Kitchen   Physical Examination Vitals:   02/09/17 1106  BP: 110/74  Pulse:  75   Vitals:   02/09/17 1106  Weight: 189 lb (85.7 kg)  Height:  (1.575 m)    Gen: resting comfortably, no acute distress HEENT: no scleral icterus, pupils equal round and reactive, no palptable cervical adenopathy,  CV: RRR, no m/r/g, no jvd Resp: Clear to auscultation bilaterally GI: abdomen is soft, non-tender, non-distended, normal bowel sounds, no hepatosplenomegaly MSK: extremities are warm, no edema.  Skin: warm, no rash Neuro:  no focal deficits Psych: appropriate affect   Diagnostic Studies  07/2008 cath [facility] PROCEDURE:  Left heart catheterization. Selective coronary angiography. Left ventriculography. Right femoral  angiography. All performed by right femoral approach using a modified Seldinger technique.  PROCEDURE NOTE: The patient was brought to the cath lab, prepped and draped in the usual fashion. Lidocaine 1% was infiltrated into the skin over the right femoral artery approximately 1 cm above the right inguinal ligament. Using modified Seldinger technique, a 5 French sheath was inserted. Selective left and right coronary angiography was performed using a 5 French Judkins left catheter. Left ventriculography was performed using a 5 French pigtail catheter. Right femoral angiography was performed using a 5 French femoral sheath. The patient tolerated the procedure very well. Prior to leaving the cath lab, the sheath was pulled out and homeostasis was obtained by Angio-Seal device.  TECHNICAL DATA: Total amount of contrast material used was 89 cc with a fluoro time of 1.2 minutes.  FLUOROSCOPY: Fluoroscopy revealed proximal coronary calcification.  HEMODYNAMIC DATA: LV (pre-angio) 118 with an end-diastolic pressure of 12 mmHg. LV (post-angio) 112 with an end-diastolic pressure of 19 mmHg. Central aortic pressure was 104/61 with a mean of 82 mmHg. There was no significant gradient recorded across the aortic valve.  LEFT VENTRICULOGRAPHY: Left ventriculography  revealed a normal-sized left ventricular cavity, with no hypokinesia, with ejection fraction which appears to be in the range of 65%. There is a mild mitral regurgitation seen.  RIGHT FEMORAL ANGIOGRAPHY: Right femoral angiography revealed the point of sheath insertion just above the bifurcation. It was suitable enough for Angio-Seal deployment and it was deployed successfully.  CORONARY ANATOMY:   A. LEFT MAIN CORONARY ARTERY: The left main appears to be patent.  B. LEFT ANTERIOR DESCENDING ARTERY: The left anterior descending artery courses normally from the left main in the anterior inter ventricular groove and gives rise diagonal branches and normal arcades of septal perforater branches. There is No stenosis proximal distal small. Diagonal branch appears to have no stenosis.  C. LEFT CIRCUMFLEX CORONARY ARTERY: The left circumflex artery is a co-dominant vessel with obtuse marginal branches. There is proximal no stenosis. Marginal branch are free of significant disease.  D. RIGHT CORONARY ARTERY: The right coronary artery arises from the right cusp and courses in the posterior inter ventricular groove. It is a dominant vessel. Gives rise to posterior descending artery and posterolateral branches. There is no stenosis.  COMPLICATION: None.  FINAL IMPRESSION:  CAD as discussed above with normal ejection fraction.  RECOMMENDATIONS:  Medical Management with aggressive risk factors modification. Statin therapy, smoke cessation counseling.    01/2017 Echo ------------------------------------------------------------------- Study Conclusions  - Left ventricle: The cavity size was normal. Systolic function was   normal. The estimated ejection fraction was 60%. Wall motion was   normal; there were no regional wall motion abnormalities. Doppler   parameters are consistent with abnormal left ventricular   relaxation (grade 1 diastolic dysfunction). Mild focal basal   septal  hypertrophy.    Assessment and Plan  1. HTN - her bp is at goal, she will continue current meds  2. Hyperlipidemia - continue statin  3. Chest pain - long history of chest pain, primarily stress related. Negative workup in 2009 with cath. EKG in clinic today SR, no ischemic changes - no significant symptoms since lsa tvisit.  - . If symptoms progress can consider repeat ischemic testing.   4. HOCM screening - first degree relateive with HOCM, we will obtain echo. Her recent echo was normal, continue to monitor  5. Palpitations - wean caffeine and follow symptoms, pending progression consider event monitor  6. Dizziness - orthostatics  negative in clinic. Main oral intake is caffeinated beverages. Follow symptoms with increased hydration. Consider event monitor in the future pending response.    F/u 3 months      Antoine Poche, M.D.

## 2017-02-09 NOTE — Patient Instructions (Signed)
Your physician recommends that you schedule a follow-up appointment in: 3 Months with Dr. Branch.   Your physician recommends that you continue on your current medications as directed. Please refer to the Current Medication list given to you today.  If you need a refill on your cardiac medications before your next appointment, please call your pharmacy.  Thank you for choosing [facility]!    

## 2017-05-24 ENCOUNTER — Ambulatory Visit: Payer: Self-pay | Admitting: Cardiology

## 2017-07-15 ENCOUNTER — Encounter (HOSPITAL_BASED_OUTPATIENT_CLINIC_OR_DEPARTMENT_OTHER): Payer: Self-pay | Admitting: Emergency Medicine

## 2017-07-15 ENCOUNTER — Emergency Department (HOSPITAL_BASED_OUTPATIENT_CLINIC_OR_DEPARTMENT_OTHER)
Admission: EM | Admit: 2017-07-15 | Discharge: 2017-07-15 | Disposition: A | Discharge status: Home / Self Care | Attending: Emergency Medicine | Admitting: Emergency Medicine

## 2017-07-15 DIAGNOSIS — X58XXXA Exposure to other specified factors, initial encounter: Secondary | ICD-10-CM | POA: Diagnosis not present

## 2017-07-15 DIAGNOSIS — S0501XA Injury of conjunctiva and corneal abrasion without foreign body, right eye, initial encounter: Secondary | ICD-10-CM | POA: Insufficient documentation

## 2017-07-15 DIAGNOSIS — I10 Essential (primary) hypertension: Secondary | ICD-10-CM | POA: Diagnosis not present

## 2017-07-15 DIAGNOSIS — E119 Type 2 diabetes mellitus without complications: Secondary | ICD-10-CM | POA: Diagnosis not present

## 2017-07-15 DIAGNOSIS — Y929 Unspecified place or not applicable: Secondary | ICD-10-CM | POA: Diagnosis not present

## 2017-07-15 DIAGNOSIS — F17210 Nicotine dependence, cigarettes, uncomplicated: Secondary | ICD-10-CM | POA: Diagnosis not present

## 2017-07-15 DIAGNOSIS — Z79899 Other long term (current) drug therapy: Secondary | ICD-10-CM | POA: Insufficient documentation

## 2017-07-15 DIAGNOSIS — Y939 Activity, unspecified: Secondary | ICD-10-CM | POA: Insufficient documentation

## 2017-07-15 DIAGNOSIS — I2510 Atherosclerotic heart disease of native coronary artery without angina pectoris: Secondary | ICD-10-CM | POA: Insufficient documentation

## 2017-07-15 DIAGNOSIS — Y999 Unspecified external cause status: Secondary | ICD-10-CM | POA: Diagnosis not present

## 2017-07-15 DIAGNOSIS — J449 Chronic obstructive pulmonary disease, unspecified: Secondary | ICD-10-CM | POA: Insufficient documentation

## 2017-07-15 DIAGNOSIS — H5711 Ocular pain, right eye: Secondary | ICD-10-CM | POA: Diagnosis present

## 2017-07-15 HISTORY — DX: Pure hypercholesterolemia, unspecified: E78.00

## 2017-07-15 HISTORY — DX: Essential (primary) hypertension: I10

## 2017-07-15 MED ORDER — FLUORESCEIN SODIUM 0.6 MG OP STRP
ORAL_STRIP | OPHTHALMIC | Status: AC
  Filled 2017-07-15: qty 1

## 2017-07-15 MED ORDER — TETRACAINE HCL 0.5 % OP SOLN
1.0000 [drp] | Freq: Once | OPHTHALMIC | Status: AC
  Administered 2017-07-15: 1 [drp] via OPHTHALMIC
  Filled 2017-07-15: qty 4

## 2017-07-15 MED ORDER — FLUORESCEIN SODIUM 1 MG OP STRP
1.0000 | ORAL_STRIP | Freq: Once | OPHTHALMIC | Status: AC
  Administered 2017-07-15: 1 via OPHTHALMIC

## 2017-07-15 MED ORDER — DICLOFENAC SODIUM 0.1 % OP SOLN: 1.0000 [drp] | Freq: Four times a day (QID) | OPHTHALMIC | 0 refills | Status: AC

## 2017-07-15 MED ORDER — ERYTHROMYCIN 5 MG/GM OP OINT
1.0000 "application " | TOPICAL_OINTMENT | Freq: Once | OPHTHALMIC | Status: AC
  Administered 2017-07-15: 1 via OPHTHALMIC
  Filled 2017-07-15: qty 3.5

## 2017-07-15 MED ORDER — HYDROCODONE-HOMATROPINE 5-1.5 MG/5ML PO SYRP: 5.0000 mL | ORAL_SOLUTION | Freq: Four times a day (QID) | ORAL | 0 refills | Status: AC | PRN

## 2017-07-15 NOTE — ED Triage Notes (Signed)
R eye redness and pain since Wednesday. States vision looks hazy, eye is swollen. Sent from [facility] for further eval.

## 2017-07-15 NOTE — ED Provider Notes (Signed)
[facility] Provider [facility]   CSN: [id number] Arrival date & time: 07/15/17  1609     History   Chief Complaint Chief Complaint  Patient presents with  . Eye Pain    HPI Alexis Munoz is a 61 y.o. female.  HPI  Patient presents from urgent care with concern of eye pain. She notes that following her diagnosis of pneumonia last week, she developed pain, discoloration in the right eye. There is some vision loss, associated with the swelling in the eye, and blurriness itself. There is associated photophobia, no pain with eye motion No surrounding redness.  The patient was recently diagnosed with pneumonia she continues to smoke cigarettes. I discussed smoking cessation with her.  Patient is taking azithromycin, as well as cough medication for relief. No notable changes in her ongoing cough, no new fever, negative chills, no new headache, no new confusion, disorientation.    Past Medical History:  Diagnosis Date  . Anxiety   . Arthritis   . COPD (chronic obstructive pulmonary disease) (HCC)    ongoing tobacco abuse  . Coronary artery disease   . Depression   . Diabetes mellitus without complication (HCC)   . Diabetes mellitus, type II (HCC)   . High cholesterol   . Hypertension   . Mood disorder (HCC)   . Restless leg   . Sleep apnea   . Thyroid disease     Patient Active Problem List   Diagnosis Date Noted  . Essential hypertension 05/22/2015  . Hyperlipidemia 05/22/2015  . 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 BREAST SURGERY    . CARDIAC CATHETERIZATION    . CARPAL TUNNEL RELEASE    . NECK SURGERY    . TUBAL LIGATION      OB History    Gravida Para Term Preterm AB Living   SAB TAB Ectopic Multiple Live Births                   Home Medications    Prior to Admission medications   Medication Sig Start Date End Date Taking? Authorizing Provider  budesonide-formoterol  (SYMBICORT) 160-4.5 MCG/ACT inhaler Inhale 2 puffs into the lungs 2 (two) times daily. 08/27/14   Burgess Amor, PA-C  busPIRone (BUSPAR) 10 MG tablet Take 10 mg by mouth 2 (two) times daily. 12/30/16   [provider]  cyclobenzaprine (FLEXERIL) 10 MG tablet Take 10 mg by mouth 3 (three) times daily as needed for muscle spasms.    [provider]  diclofenac (VOLTAREN) 0.1 % ophthalmic solution Place 1 drop into the right eye 4 (four) times daily. 07/15/17   Gerhard Munch, MD  doxepin (SINEQUAN) 10 MG capsule TAKE 1 TO 2 CAPSULES BY MOUTH AT BEDTIME AS NEEDED FOR SLEEP 02/01/17   [provider]  DULoxetine (CYMBALTA) 60 MG capsule Take 1 capsule (60 mg total) by mouth 2 (two) times daily. 04/03/15   Myrlene Broker, MD  HYDROcodone-homatropine [facility]) 5-1.5 MG/5ML syrup Take 5 mLs by mouth every 6 (six) hours as needed for cough. 07/15/17   Gerhard Munch, MD  hydrOXYzine (ATARAX/VISTARIL) 25 MG tablet Take 25 mg by mouth daily as needed.    [provider]  ipratropium-albuterol (DUONEB) 0.5-2.5 (3) MG/3ML SOLN Take 3 mLs by nebulization 4 (four) times daily.    [provider]  lamoTRIgine (LAMICTAL) 100 MG tablet Take 100 mg  by mouth 2 (two) times daily.    [provider]  levothyroxine (SYNTHROID, LEVOTHROID) 50 MCG tablet Take 50 mcg by mouth daily before breakfast.    [provider]  metoprolol succinate (TOPROL-XL) 25 MG 24 hr tablet Take 12.5 mg by mouth at bedtime.    [provider]  oxybutynin (DITROPAN-XL) 10 MG 24 hr tablet Take 10 mg by mouth. 11/21/16   [provider]  pantoprazole (PROTONIX) 40 MG tablet Take 40 mg by mouth daily.    [provider]  pravastatin (PRAVACHOL) 40 MG tablet Take 40 mg by mouth at bedtime.    [provider]  rOPINIRole (REQUIP) 1 MG tablet Take 1 mg by mouth at bedtime.    [provider]  spironolactone (ALDACTONE) 25 MG tablet Take 12.5 mg by  mouth. 11/17/16 11/17/17  [provider]  traZODone (DESYREL) 50 MG tablet Take 50 mg by mouth at bedtime.    [provider]  vitamin B-12 (CYANOCOBALAMIN) 500 MCG tablet Take 500 mcg by mouth daily.    [provider]    Family History Family History  Problem Relation Age of Onset  . Cancer Mother   . Alcohol abuse Mother   . Diabetes Father   . Cancer Father   . Depression Father   . Alcohol abuse Father   . Diabetes Sister   . Heart failure Sister   . Schizophrenia Sister   . Depression Sister   . Cancer Brother   . Schizophrenia Other     Social History Social History  Substance Use Topics  . Smoking status: Current Every Day Smoker    Packs/day: 0.50    Types: Cigarettes  . Smokeless tobacco: Never Used  . Alcohol use Yes     Comment: occ     Allergies   Clarithromycin; Sulfa antibiotics; and Amoxicillin   Review of Systems Review of Systems  Constitutional:       Per HPI, otherwise negative  HENT:       Per HPI, otherwise negative  Eyes:       Per history of present illness  Respiratory:       Per HPI, otherwise negative  Cardiovascular:       Per HPI, otherwise negative  Gastrointestinal: Negative for vomiting.  Endocrine:       Negative aside from HPI  Genitourinary:       Neg aside from HPI   Musculoskeletal:       Per HPI, otherwise negative  Skin: Negative.   Neurological: Negative for syncope.     Physical Exam Updated Vital Signs BP (!) 127/59 (BP Location: Left Arm)   Pulse 79   Temp 98.4 F (36.9 C) (Oral)   Resp 16   Ht  (1.575 m)   Wt 84.4 kg (186 lb)   SpO2 97%   BMI 34.02 kg/m   Physical Exam  Constitutional: She is oriented to person, place, and time. She appears well-developed and well-nourished. No distress.  HENT:  Head: Normocephalic and atraumatic.  Eyes: EOM are normal. Right eye exhibits discharge. Right eye exhibits no exudate and no hordeolum. No foreign body present in the right  eye. Left eye exhibits no chemosis, no discharge, no exudate and no hordeolum. No foreign body present in the left eye. Right conjunctiva is injected. Right conjunctiva has a hemorrhage. Left conjunctiva is not injected. Left conjunctiva has no hemorrhage. Right eye exhibits normal extraocular motion and no nystagmus. Left eye exhibits  normal extraocular motion and no nystagmus. Right pupil is round and reactive. Left pupil is round and reactive.  Slit lamp exam:      The right eye shows corneal abrasion and fluorescein uptake. The right eye shows no anterior chamber bulge.  Cardiovascular: Normal rate and regular rhythm.   Pulmonary/Chest: Effort normal and breath sounds normal. No stridor. No respiratory distress.  Abdominal: She exhibits no distension.  Musculoskeletal: She exhibits no edema.  Neurological: She is alert and oriented to person, place, and time. No cranial nerve deficit.  Skin: Skin is warm and dry.  Psychiatric: She has a normal mood and affect.  Nursing note and vitals reviewed.    ED Treatments / Results   Procedures Procedures (including critical care time)  Medications Ordered in ED Medications  erythromycin ophthalmic ointment 1 application (not administered)  fluorescein ophthalmic strip 1 strip (1 strip Both Eyes Given by Other 07/15/17 1720)  tetracaine (PONTOCAINE) 0.5 % ophthalmic solution 1 drop (1 drop Right Eye Given by Other 07/15/17 1720)     Initial Impression / Assessment and Plan / ED Course  I have reviewed the triage vital signs and the nursing notes.  Pertinent labs & imaging results that were available during my care of the patient were reviewed by me and considered in my medical decision making (see chart for details).  Patient who is currently receiving therapy for pneumonia presents with right eye discomfort, vision loss per Patient is awake and alert, no neurologic complaints, no fever, no pain with eye motion, all reassuring for the low  suspicion of intracranial pathology or septal cellulitis per No surrounding erythema, suspicion for periorbital cellulitis. Patient does have injected eye, and after application of tetracaine, patient had spontaneous opening, closing of the eye, no discomfort. Patient is found to have a notable corneal abrasion anteriorly. Patient received an eye patch, ongoing antibiotic therapy, topical medication, will follow-up with ophthalmology.  Final Clinical Impressions(s) / ED Diagnoses   Final diagnoses:  Abrasion of right cornea, initial encounter    New Prescriptions New Prescriptions   DICLOFENAC (VOLTAREN) 0.1 % OPHTHALMIC SOLUTION    Place 1 drop into the right eye 4 (four) times daily.   HYDROCODONE-HOMATROPINE (HYCODAN) 5-1.5 MG/5ML SYRUP    Take 5 mLs by mouth every 6 (six) hours as needed for cough.     Gerhard Munch, MD 07/15/17 [phone]

## 2017-07-15 NOTE — Discharge Instructions (Signed)
As discussed, with her corneal abrasion at his apartment monitor your condition carefully, and be sure to follow up with our ophthalmologist in 2 days.  Please use the provided erythromycin ointment 4 times daily, and prescriptions as directed.  Return here for concerning changes in your condition.

## 2017-07-15 NOTE — ED Notes (Signed)
Eye patch given

## 2019-10-04 ENCOUNTER — Other Ambulatory Visit: Payer: Self-pay

## 2019-10-04 ENCOUNTER — Emergency Department (HOSPITAL_COMMUNITY)
Admission: EM | Admit: 2019-10-04 | Discharge: 2019-10-04 | Disposition: A | Discharge status: Home / Self Care | Attending: Emergency Medicine | Admitting: Emergency Medicine

## 2019-10-04 ENCOUNTER — Encounter (HOSPITAL_COMMUNITY): Payer: Self-pay | Admitting: Emergency Medicine

## 2019-10-04 ENCOUNTER — Emergency Department (HOSPITAL_COMMUNITY)

## 2019-10-04 ENCOUNTER — Ambulatory Visit (INDEPENDENT_AMBULATORY_CARE_PROVIDER_SITE_OTHER)
Admission: EM | Admit: 2019-10-04 | Discharge: 2019-10-04 | Disposition: A | Discharge status: Home / Self Care | Source: Home / Self Care

## 2019-10-04 DIAGNOSIS — M25532 Pain in left wrist: Secondary | ICD-10-CM | POA: Diagnosis not present

## 2019-10-04 DIAGNOSIS — Z5321 Procedure and treatment not carried out due to patient leaving prior to being seen by health care provider: Secondary | ICD-10-CM | POA: Diagnosis not present

## 2019-10-04 DIAGNOSIS — M79642 Pain in left hand: Secondary | ICD-10-CM | POA: Diagnosis not present

## 2019-10-04 MED ORDER — IBUPROFEN 600 MG PO TABS: 600.0000 mg | ORAL_TABLET | Freq: Four times a day (QID) | ORAL | 0 refills | Status: AC | PRN

## 2019-10-04 NOTE — ED Triage Notes (Signed)
Patient reports L hand pain that started 2 weeks ago but has become worse. No known injury.

## 2019-10-04 NOTE — ED Notes (Signed)
Not in WR when called 

## 2019-10-04 NOTE — Discharge Instructions (Addendum)
Advised patient to use ibuprofen as prescribed with food To use RICE instruction as attached Advised patient to continue to use wrist splint To follow-up with primary care To return if symptoms get worse

## 2019-10-04 NOTE — ED Provider Notes (Signed)
[facility]    CSN: [id number] Arrival date & time: 10/04/19  1911      History   Chief Complaint Chief Complaint  Patient presents with  . Hand Pain    HPI Alexis Munoz is a 63 y.o. female.   Alexis Munoz [age] y old female old presented to the urgent care with a complaint of left wrist pain for the past 2 weeks.  It develop gradually.  He localizes the pain to the left wrist.  He describes the pain as constant and achy and rated at 6 on a scale of 1-10. Pain is at 7 on a range of motion (ROM). He has tried OTC tylenol medications without relief.  His symptoms are made worse with movement and lifting heavy object. Patient had a wrist splint on at this time.  He denies similar symptoms in the past.     Hand Pain    Past Medical History:  Diagnosis Date  . Anxiety   . Arthritis   . COPD (chronic obstructive pulmonary disease) (HCC)    ongoing tobacco abuse  . Coronary artery disease   . Depression   . Diabetes mellitus without complication (HCC)   . Diabetes mellitus, type II (HCC)   . High cholesterol   . Hypertension   . Mood disorder (HCC)   . Restless leg   . Sleep apnea   . Thyroid disease     Patient Active Problem List   Diagnosis Date Noted  . Essential hypertension 05/22/2015  . Hyperlipidemia 05/22/2015  . 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 BREAST SURGERY    . CARDIAC CATHETERIZATION    . CARPAL TUNNEL RELEASE    . NECK SURGERY    . TUBAL LIGATION      OB History    Gravida  2   Para  2   Term  2   Preterm      AB      Living        SAB      TAB      Ectopic      Multiple      Live Births               Home Medications    Prior to Admission medications   Medication Sig Start Date End Date Taking? Authorizing Provider  budesonide-formoterol (SYMBICORT) 160-4.5 MCG/ACT inhaler Inhale 2 puffs into the lungs 2 (two) times daily. 08/27/14   Burgess AmorIdol, Julie,  PA-C  busPIRone (BUSPAR) 10 MG tablet Take 30 mg by mouth 2 (two) times daily.  12/30/16   [provider]  cyclobenzaprine (FLEXERIL) 10 MG tablet Take 10 mg by mouth 3 (three) times daily as needed for muscle spasms.    [provider]  diclofenac (VOLTAREN) 0.1 % ophthalmic solution Place 1 drop into the right eye 4 (four) times daily. 07/15/17   Gerhard MunchLockwood, Robert, MD  doxepin (SINEQUAN) 10 MG capsule TAKE 1 TO 2 CAPSULES BY MOUTH AT BEDTIME AS NEEDED FOR SLEEP 02/01/17   [provider]  DULoxetine (CYMBALTA) 60 MG capsule Take 1 capsule (60 mg total) by mouth 2 (two) times daily. 04/03/15   Myrlene Brokeross, Deborah R, MD  HYDROcodone-homatropine [facility](HYCODAN) 5-1.5 MG/5ML syrup Take 5 mLs by mouth every 6 (six) hours as needed for cough. 07/15/17   Gerhard MunchLockwood, Robert, MD  hydrOXYzine (ATARAX/VISTARIL) 25 MG tablet Take 25 mg by mouth daily as needed.  [provider]  ibuprofen (ADVIL) 600 MG tablet Take 1 tablet (600 mg total) by mouth every 6 (six) hours as needed. Take with food 10/04/19   Arsema Tusing, Zachery Dakins, FNP  ipratropium-albuterol (DUONEB) 0.5-2.5 (3) MG/3ML SOLN Take 3 mLs by nebulization 4 (four) times daily.    [provider]  lamoTRIgine (LAMICTAL) 100 MG tablet Take 100 mg by mouth 2 (two) times daily.    [provider]  levothyroxine (SYNTHROID, LEVOTHROID) 50 MCG tablet Take 50 mcg by mouth daily before breakfast.    [provider]  metoprolol succinate (TOPROL-XL) 25 MG 24 hr tablet Take 12.5 mg by mouth at bedtime.    [provider]  oxybutynin (DITROPAN-XL) 10 MG 24 hr tablet Take 10 mg by mouth. 11/21/16   [provider]  pantoprazole (PROTONIX) 40 MG tablet Take 40 mg by mouth daily.    [provider]  pravastatin (PRAVACHOL) 40 MG tablet Take 40 mg by mouth at bedtime.    [provider]  rOPINIRole (REQUIP) 1 MG tablet Take 1 mg by mouth at bedtime.    [provider]  spironolactone  (ALDACTONE) 25 MG tablet Take 12.5 mg by mouth. 11/17/16 11/17/17  [provider]  traZODone (DESYREL) 50 MG tablet Take 50 mg by mouth at bedtime.    [provider]  vitamin B-12 (CYANOCOBALAMIN) 500 MCG tablet Take 500 mcg by mouth daily.    [provider]    Family History Family History  Problem Relation Age of Onset  . Cancer Mother   . Alcohol abuse Mother   . Diabetes Father   . Cancer Father   . Depression Father   . Alcohol abuse Father   . Diabetes Sister   . Heart failure Sister   . Schizophrenia Sister   . Depression Sister   . Cancer Brother   . Schizophrenia Other     Social History Social History   Tobacco Use  . Smoking status: Current Every Day Smoker    Packs/day: 0.50    Types: Cigarettes  . Smokeless tobacco: Never Used  Substance Use Topics  . Alcohol use: Not Currently    Comment: occ  . Drug use: No     Allergies   Clarithromycin, Sulfa antibiotics, and Amoxicillin   Review of Systems Review of Systems  Constitutional: Negative.   Respiratory: Negative.   Cardiovascular: Negative.   Musculoskeletal:       Left wrist pain  ROS: All other are negatives   Physical Exam Triage Vital Signs ED Triage Vitals  Enc Vitals Group     BP 10/04/19 1949 116/72     Pulse --      Resp 10/04/19 1949 20     Temp 10/04/19 1949 98.3 F (36.8 C)     Temp Source 10/04/19 1949 Oral     SpO2 10/04/19 1949 93 %     Weight --      Height --      Head Circumference --      Peak Flow --      Pain Score 10/04/19 1946 8     Pain Loc --      Pain Edu? --      Excl. in GC? --    No data found.  Updated Vital Signs BP 116/72   Temp 98.3 F (36.8 C) (Oral)   Resp 20   SpO2 93%   Visual Acuity Right Eye Distance:   Left Eye Distance:  Bilateral Distance:    Right Eye Near:   Left Eye Near:    Bilateral Near:     Physical Exam Constitutional:      General: She is not in acute distress.    Appearance: Normal  appearance. She is normal weight. She is not ill-appearing or toxic-appearing.  Cardiovascular:     Rate and Rhythm: Normal rate and regular rhythm.     Pulses: Normal pulses.     Heart sounds: Normal heart sounds. No murmur.  Pulmonary:     Effort: No respiratory distress.     Breath sounds: Normal breath sounds. No wheezing.  Chest:     Chest wall: No tenderness.  Musculoskeletal:        General: Tenderness present. No swelling, deformity or signs of injury. Normal range of motion.     Right wrist: Normal.     Left wrist: Tenderness present. No swelling or bony tenderness. Normal range of motion.     Right lower leg: No edema.     Left lower leg: No edema.     Comments: Left wrist is without obvious asymmetry or deformity when compared to the right wrist.  No surface trauma, wounds, swelling or obvious deformity.  No bony crepitus, normal flexion and extension motor sensory function intact.  Negative phalen's test  Neurological:     General: No focal deficit present.     Mental Status: She is alert and oriented to person, place, and time.      UC Treatments / Results  Labs (all labs ordered are listed, but only abnormal results are displayed) Labs Reviewed - No data to display  EKG   Radiology No results found.  Procedures Procedures (including critical care time)  Medications Ordered in UC Medications - No data to display  Initial Impression / Assessment and Plan / UC Course  I have reviewed the triage vital signs and the nursing notes.  Pertinent labs & imaging results that were available during my care of the patient were reviewed by me and considered in my medical decision making (see chart for details).   Patient stable for discharge.  Benign physical exam.  Stable left wrist with full range of motion.  Advised patient to use NSAIDs ibuprofen and to follow rice instruction.  To follow-up with primary care or to return his for symptom worsening.   Final Clinical  Impressions(s) / UC Diagnoses   Final diagnoses:  Left wrist pain     Discharge Instructions     Advised patient to use ibuprofen as prescribed with food To use RICE instruction as attached Advised patient to continue to use wrist splint To follow-up with primary care To return if symptoms get worse    ED Prescriptions    Medication Sig Dispense Auth. Provider   ibuprofen (ADVIL) 600 MG tablet Take 1 tablet (600 mg total) by mouth every 6 (six) hours as needed. Take with food 30 tablet Elzada Pytel, Darrelyn Hillock, FNP     I have reviewed the PDMP during this encounter.   Emerson Monte, FNP 10/04/19 2027

## 2019-10-04 NOTE — ED Triage Notes (Signed)
Pt here for hand pain that developed 2 weeks ago , no injury

## 2022-09-19 ENCOUNTER — Ambulatory Visit (INDEPENDENT_AMBULATORY_CARE_PROVIDER_SITE_OTHER): Payer: Medicare Other | Admitting: Podiatry

## 2022-09-19 DIAGNOSIS — Z91199 Patient's noncompliance with other medical treatment and regimen due to unspecified reason: Secondary | ICD-10-CM

## 2022-09-19 NOTE — Progress Notes (Signed)
Patient was no-show for appointment today 

## 2023-01-17 ENCOUNTER — Ambulatory Visit: Payer: Medicare Other | Admitting: Podiatry
